# Patient Record
Sex: Female | Born: 1975 | Race: White | Hispanic: No | Marital: Married | Smoking: Never smoker
Health system: Southern US, Community
[De-identification: ages and names within clinical notes are randomized; demographics above are authoritative.]

## PROBLEM LIST (undated history)

## (undated) DIAGNOSIS — Z9889 Other specified postprocedural states: Secondary | ICD-10-CM

## (undated) DIAGNOSIS — R112 Nausea with vomiting, unspecified: Secondary | ICD-10-CM

## (undated) HISTORY — PX: BREAST SURGERY: SHX581

---

## 2000-11-26 ENCOUNTER — Emergency Department (HOSPITAL_COMMUNITY): Admission: EM | Admit: 2000-11-26 | Discharge: 2000-11-26 | Payer: Self-pay | Admitting: Emergency Medicine

## 2001-01-17 ENCOUNTER — Emergency Department (HOSPITAL_COMMUNITY): Admission: EM | Admit: 2001-01-17 | Discharge: 2001-01-17 | Payer: Self-pay | Admitting: Emergency Medicine

## 2011-07-27 HISTORY — PX: AUGMENTATION MAMMAPLASTY: SUR837

## 2014-02-21 DIAGNOSIS — N920 Excessive and frequent menstruation with regular cycle: Secondary | ICD-10-CM | POA: Insufficient documentation

## 2014-02-21 DIAGNOSIS — Z0000 Encounter for general adult medical examination without abnormal findings: Secondary | ICD-10-CM | POA: Insufficient documentation

## 2016-01-20 DIAGNOSIS — C4491 Basal cell carcinoma of skin, unspecified: Secondary | ICD-10-CM

## 2016-01-20 HISTORY — DX: Basal cell carcinoma of skin, unspecified: C44.91

## 2017-11-21 ENCOUNTER — Encounter: Payer: Self-pay | Admitting: Obstetrics & Gynecology

## 2017-11-21 ENCOUNTER — Ambulatory Visit (INDEPENDENT_AMBULATORY_CARE_PROVIDER_SITE_OTHER): Payer: BLUE CROSS/BLUE SHIELD | Admitting: Obstetrics & Gynecology

## 2017-11-21 VITALS — BP 120/80 | Ht 63.0 in | Wt 159.0 lb

## 2017-11-21 DIAGNOSIS — O209 Hemorrhage in early pregnancy, unspecified: Secondary | ICD-10-CM | POA: Insufficient documentation

## 2017-11-21 NOTE — Progress Notes (Signed)
Obstetric Problem Visit   Chief Complaint:  Chief Complaint  Patient presents with  . Vaginal Bleeding   History of Present Illness: Patient is a 42 y.o. G2P1001  presenting for first trimester bleeding.  The onset of bleeding was Friday spotting then heavy on Sunday.  No pain, nausea.  Some fatigue, headache (also has stopped caffeine 4/16 when had first pos urine hcg). LMP 10/11/17    EDC 07/18/18    5 6/7 weeks now    First urine preg test pos = 11/08/17 Is bleeding equal to or greater than normal menstrual flow:  Yes Any recent trauma:  No Recent intercourse:  No History of prior miscarriage:  No Prior ultrasound demonstrating IUP:  No Prior ultrasound demonstrating viable IUP:  No Prior Serum HCG:  No Rh status: unk  PMHx: She  has no past medical history on file. Also,  has a past surgical history that includes Breast surgery., family history is not on file.,  reports that she has never smoked. She has never used smokeless tobacco. She reports that she does not drink alcohol or use drugs. HY:WVPXT NSVD 7 years ago after 67 hours of labor. [facility])  She has a current medication list which includes the following prescription(s): [facility]. Also, has No Known Allergies.  Review of Systems  Constitutional: Negative for chills, fever and malaise/fatigue.  HENT: Negative for congestion, sinus pain and sore throat.   Eyes: Negative for blurred vision and pain.  Respiratory: Negative for cough and wheezing.   Cardiovascular: Negative for chest pain and leg swelling.  Gastrointestinal: Negative for abdominal pain, constipation, diarrhea, heartburn, nausea and vomiting.  Genitourinary: Negative for dysuria, frequency, hematuria and urgency.  Musculoskeletal: Negative for back pain, joint pain, myalgias and neck pain.  Skin: Negative for itching and rash.  Neurological: Negative for dizziness, tremors and weakness.  Endo/Heme/Allergies: Does not bruise/bleed easily.    Psychiatric/Behavioral: Negative for depression. The patient is not nervous/anxious and does not have insomnia.    Objective: Vitals:   11/21/17 1054  BP: 120/80   Physical Exam  Constitutional: She is oriented to person, place, and time. She appears well-developed and well-nourished. No distress.  Musculoskeletal: Normal range of motion.  Neurological: She is alert and oriented to person, place, and time.  Skin: Skin is warm and dry.  Psychiatric: She has a normal mood and affect.  Vitals reviewed.  Assessment: 42 y.o. G2P1001 5 6/7 weeks based on LMP 1. First trimester bleeding Concern for miscarriage based on amount of bleeding - Beta hCG quant (ref lab) - Beta hCG quant (ref lab); Future  1) First trimester bleeding - incidence and clinical course of first trimester bleeding is discussed in detail with the patient today.  Approximately 1/3 of pregnancies ending in live births experienced 1st trimester bleeding.  The amount of bleeding is variable and not necessarily predictive of outcome.  Sources may be cervical or uterine.  Subchorionic hemorrhages are a frequent concurrent findings on ultrasound and are followed expectantly.  These often absorb or regress spontaneously although risk for expansion and further disruption of the utero-placental interface leading to miscarriage is possible.  There is no clearly documented benefit to limiting or modifying activity and sexual intercourse in altering clinic course of 1st trimester bleeding.    2) If not already done will proceed with TVUS evaluation to document viability, and if uncertain viability or absence of a demonstrable IUP (and no previous documentation of IUP) will trend HCG levels.  3) The  patient is Rh unk, rhogam is therefore possibly indicated to decrease the risk rhesus alloimmunization.  Will check level.  4) Routine bleeding precautions were discussed with the patient prior the conclusion of today's visit.  Barnett Applebaum,  MD, [facility], [facility] 11/21/2017  11:27 AM

## 2017-11-21 NOTE — Patient Instructions (Signed)
Human Chorionic Gonadotropin Test °Human chorionic gonadotropin (hCG) is a hormone produced during pregnancy by the cells that form the placenta. The placenta is the organ that grows inside your womb (uterus) to nourish a developing baby. When you are pregnant, hCG starts to appear in your blood about 11 days after conception. It continues to go up for the first 8-11 weeks of pregnancy. °Your hCG level can be measured with several different types of tests. You may have: °· A urine test. °? hCG is eliminated from your body by your kidneys, so a urine test is one way to check for this hormone. °? A urine test only shows whether there is hCG in your urine. It does not measure how much. °? You may have a urine test to find out whether you are pregnant. °? A home pregnancy test detects whether there is hCG in your urine. °· A qualitative blood test. °? Like the urine test, this blood test only shows whether there is hCG in your blood. It does not measure how much. °? You may have this type of blood test to find out whether you are pregnant. °· A quantitative blood test. °? This type of blood test measures the amount of hCG in your blood. °? You may have this type of test to diagnose an abnormal pregnancy or determine whether you are at risk of, or have had, a failed pregnancy (miscarriage). ° °How do I prepare for this test? °For the urine test: °· Limit your fluid intake before the urine test as directed by your health care provider. °· Collect the sample the first time you urinate in the morning. °· Let your health care provider know if you have blood in your urine. This may interfere with the test result. ° °Some medicines may interfere with the urine and blood tests. Let your health care provider know about all the medicines you are taking. No additional preparation is required for the blood test. °What do the results mean? °It is your responsibility to obtain your test results. Ask the lab or department performing  the test when and how you will get your results. Talk to your health care provider if you have any questions about your test results. °The results of the hCG urine test and the qualitative hCG blood test are either positive or negative. The results of the quantitative hCG blood test are reported as a number. hCG is measured in international units per liter (IU/L). °Meaning of Negative Test Results °A negative result on a urine or qualitative blood test could mean that you are not pregnant. It could also mean the test was done too early to detect hCG. If you still have other signs of pregnancy, the test should be repeated. °Meaning of Positive Test Results °A positive result on the urine or qualitative blood tests means you are most likely pregnant. Your health care provider may confirm your pregnancy with an imaging study of the inside of your uterus at 5-6 weeks (ultrasound). °Range of Normal Values °Ranges for normal values for the quantitative hCG blood test may vary among different labs and hospitals. You should always check with your health care provider after having lab work or other tests done to discuss whether your values are considered within normal limits. °· Less than 5 IU/L means it is most likely you are not pregnant. °· Greater than 25 IU/L means it is most likely you are pregnant. ° °Meaning of Results Outside Normal Value Ranges °If your hCG   level on the quantitative test is not what would be expected, you may have the test again. It may also be important for your health care provider to know whether your hCG level goes up or down over time. Common causes of results outside the normal range include: °· Being pregnant with twins (hCG level is higher than expected). °· Having an ectopic pregnancy (hCG rises more slowly than expected). °· Miscarriage (hCG level falls). °· Abnormal growths in the womb (hCG level is higher than expected). ° °Talk with your health care provider to discuss your results,  treatment options, and if necessary, the need for more tests. Talk with your health care provider if you have any questions about your results. °This information is not intended to replace advice given to you by your health care provider. Make sure you discuss any questions you have with your health care provider. °Document Released: 08/13/2004 Document Revised: 03/17/2016 Document Reviewed: 10/16/2013 °Elsevier Interactive Patient Education © 2018 Elsevier Inc. ° °

## 2017-11-22 LAB — BETA HCG QUANT (REF LAB): hCG Quant: 175 m[IU]/mL

## 2017-11-23 ENCOUNTER — Other Ambulatory Visit: Payer: BLUE CROSS/BLUE SHIELD

## 2017-11-23 DIAGNOSIS — O209 Hemorrhage in early pregnancy, unspecified: Secondary | ICD-10-CM

## 2017-11-24 ENCOUNTER — Ambulatory Visit (INDEPENDENT_AMBULATORY_CARE_PROVIDER_SITE_OTHER): Payer: BLUE CROSS/BLUE SHIELD | Admitting: Obstetrics & Gynecology

## 2017-11-24 ENCOUNTER — Encounter: Payer: Self-pay | Admitting: Obstetrics & Gynecology

## 2017-11-24 VITALS — BP 100/60 | Ht 63.0 in | Wt 160.0 lb

## 2017-11-24 DIAGNOSIS — Z86018 Personal history of other benign neoplasm: Secondary | ICD-10-CM

## 2017-11-24 DIAGNOSIS — N926 Irregular menstruation, unspecified: Secondary | ICD-10-CM

## 2017-11-24 LAB — BETA HCG QUANT (REF LAB): hCG Quant: 55 m[IU]/mL

## 2017-11-24 LAB — RH TYPE: Rh Factor: POSITIVE

## 2017-11-24 NOTE — Progress Notes (Signed)
  HPI: Pt has had less bleeding since Monday.  Min pain.  Does have headache and fatigue.   Beta hCG Wed was 55, and was 175 on Monday.  PMHx: She  has no past medical history on file. Also,  has a past surgical history that includes Breast surgery., family history is not on file.,  reports that she has never smoked. She has never used smokeless tobacco. She reports that she does not drink alcohol or use drugs.  She has a current medication list which includes the following prescription(s): [facility]. Also, has No Known Allergies.  Review of Systems  All other systems reviewed and are negative.  Objective: BP 100/60   Ht 5\' 3"  (1.6 m)   Wt 160 lb (72.6 kg)   LMP 10/11/2017   BMI 28.34 kg/m   Physical examination Constitutional NAD, Conversant  Skin No rashes, lesions or ulceration.   Extremities: Moves all appropriately.  Normal ROM for age. No lymphadenopathy.  Neuro: Grossly intact  Psych: Oriented to PPT.  Normal mood. Normal affect.   Assessment: 1. Miscarriage, with declining hCG levels and already episode of bleeding. Monitor for any change in sx's. Pt reassured. 2. History of uterine fibroid and Irregular bleeding - Plan: US PELVIS TRANSVANGINAL NON-OB (TV ONLY) - also, FH fibroids - Risks of fibroids and pregnancy discussed - AMA also discussed, and recurrent miscarriage risks counseled  A total of 25 minutes were spent face-to-face with the patient during this encounter and over half of that time dealt with counseling and coordination of care.  Barnett Applebaum, MD, [facility], [facility] 11/24/2017  4:50 PM

## 2017-12-02 ENCOUNTER — Encounter: Payer: Self-pay | Admitting: Obstetrics and Gynecology

## 2017-12-14 ENCOUNTER — Encounter: Payer: Self-pay | Admitting: Obstetrics & Gynecology

## 2017-12-14 ENCOUNTER — Ambulatory Visit (INDEPENDENT_AMBULATORY_CARE_PROVIDER_SITE_OTHER): Payer: BLUE CROSS/BLUE SHIELD

## 2017-12-14 ENCOUNTER — Ambulatory Visit (INDEPENDENT_AMBULATORY_CARE_PROVIDER_SITE_OTHER): Payer: BLUE CROSS/BLUE SHIELD | Admitting: Obstetrics & Gynecology

## 2017-12-14 VITALS — BP 100/60 | Ht 63.0 in | Wt 160.0 lb

## 2017-12-14 DIAGNOSIS — N96 Recurrent pregnancy loss: Secondary | ICD-10-CM | POA: Diagnosis not present

## 2017-12-14 DIAGNOSIS — N926 Irregular menstruation, unspecified: Secondary | ICD-10-CM | POA: Diagnosis not present

## 2017-12-14 DIAGNOSIS — Z86018 Personal history of other benign neoplasm: Secondary | ICD-10-CM

## 2017-12-14 NOTE — Progress Notes (Signed)
  HPI: Pt is a G2P1 w recent second miscarriage who presents for concerns over pelvic anatomy.  FH fibroids, also reports h/o fibroid herself in past.  Periods reg prior to recent pregnancy, has not had one since miscarriage yet.  Ultrasound demonstrates no masses seen, no cysts, no fibroids These findings are Pelvis normal  PMHx: She  has no past medical history on file. Also,  has a past surgical history that includes Breast surgery., family history is not on file.,  reports that she has never smoked. She has never used smokeless tobacco. She reports that she does not drink alcohol or use drugs.  She has a current medication list which includes the following prescription(s): [facility]. Also, has No Known Allergies.  Review of Systems  Constitutional: Negative for chills, fever and malaise/fatigue.  HENT: Negative for congestion, sinus pain and sore throat.   Eyes: Negative for blurred vision and pain.  Respiratory: Negative for cough and wheezing.   Cardiovascular: Negative for chest pain and leg swelling.  Gastrointestinal: Negative for abdominal pain, constipation, diarrhea, heartburn, nausea and vomiting.  Genitourinary: Negative for dysuria, frequency, hematuria and urgency.  Musculoskeletal: Negative for back pain, joint pain, myalgias and neck pain.  Skin: Negative for itching and rash.  Neurological: Negative for dizziness, tremors and weakness.  Endo/Heme/Allergies: Does not bruise/bleed easily.  Psychiatric/Behavioral: Negative for depression. The patient is not nervous/anxious and does not have insomnia.    Objective: BP 100/60   Ht 5\' 3"  (1.6 m)   Wt 160 lb (72.6 kg)   LMP 10/11/2017   BMI 28.34 kg/m   Physical examination Constitutional NAD, Conversant  Skin No rashes, lesions or ulceration.   Extremities: Moves all appropriately.  Normal ROM for age. No lymphadenopathy.  Neuro: Grossly intact  Psych: Oriented to PPT.  Normal mood. Normal affect.   Assessment:   Recurrent pregnancy loss Pt counseled to try again for pregnancy soon, early testing for reassurance [place] normal without fibroids  A total of 15 minutes were spent face-to-face with the patient during this encounter and over half of that time dealt with counseling and coordination of care.  Barnett Applebaum, MD, [facility], [facility] 12/14/2017  4:06 PM

## 2018-03-28 ENCOUNTER — Other Ambulatory Visit: Payer: BLUE CROSS/BLUE SHIELD

## 2018-03-28 ENCOUNTER — Telehealth: Payer: Self-pay

## 2018-03-28 ENCOUNTER — Other Ambulatory Visit: Payer: Self-pay | Admitting: Obstetrics & Gynecology

## 2018-03-28 DIAGNOSIS — O09299 Supervision of pregnancy with other poor reproductive or obstetric history, unspecified trimester: Secondary | ICD-10-CM

## 2018-03-28 NOTE — Telephone Encounter (Signed)
Patient calling triage today and states she has had a positive preg test and that [facility] told her to call once she did so she can have blood work done. Please advise/put in order if you would like this done for pt please.

## 2018-03-28 NOTE — Telephone Encounter (Signed)
Beta hCG lab today, appt Thurs day w me 11:00 or so (am).

## 2018-03-28 NOTE — Telephone Encounter (Signed)
Please call pt and put on lab schedule for today and then f/up with [facility] on Thursday

## 2018-03-28 NOTE — Telephone Encounter (Signed)
Patient is schedule 

## 2018-03-29 LAB — BETA HCG QUANT (REF LAB): HCG QUANT: 327 m[IU]/mL

## 2018-03-30 ENCOUNTER — Other Ambulatory Visit: Payer: Self-pay | Admitting: Obstetrics & Gynecology

## 2018-03-30 ENCOUNTER — Ambulatory Visit (INDEPENDENT_AMBULATORY_CARE_PROVIDER_SITE_OTHER): Payer: BLUE CROSS/BLUE SHIELD | Admitting: Obstetrics & Gynecology

## 2018-03-30 ENCOUNTER — Telehealth: Payer: Self-pay | Admitting: Obstetrics & Gynecology

## 2018-03-30 ENCOUNTER — Encounter: Payer: Self-pay | Admitting: Obstetrics & Gynecology

## 2018-03-30 VITALS — BP 100/60 | Ht 63.0 in | Wt 168.0 lb

## 2018-03-30 DIAGNOSIS — N96 Recurrent pregnancy loss: Secondary | ICD-10-CM

## 2018-03-30 DIAGNOSIS — N926 Irregular menstruation, unspecified: Secondary | ICD-10-CM

## 2018-03-30 DIAGNOSIS — Z8759 Personal history of other complications of pregnancy, childbirth and the puerperium: Secondary | ICD-10-CM | POA: Diagnosis not present

## 2018-03-30 LAB — BETA HCG QUANT (REF LAB): hCG Quant: 731 m[IU]/mL

## 2018-03-30 NOTE — Patient Instructions (Signed)
First Trimester of Pregnancy The first trimester of pregnancy is from week 1 until the end of week 13 (months 1 through 3). A week after a sperm fertilizes an egg, the egg will implant on the wall of the uterus. This embryo will begin to develop into a baby. Genes from you and your partner will form the baby. The female genes will determine whether the baby will be a boy or a girl. At 6-8 weeks, the eyes and face will be formed, and the heartbeat can be seen on ultrasound. At the end of 12 weeks, all the baby's organs will be formed. Now that you are pregnant, you will want to do everything you can to have a healthy baby. Two of the most important things are to get good prenatal care and to follow your health care provider's instructions. Prenatal care is all the medical care you receive before the baby's birth. This care will help prevent, find, and treat any problems during the pregnancy and childbirth. Body changes during your first trimester Your body goes through many changes during pregnancy. The changes vary from woman to woman.  You may gain or lose a couple of pounds at first.  You may feel sick to your stomach (nauseous) and you may throw up (vomit). If the vomiting is uncontrollable, call your health care provider.  You may tire easily.  You may develop headaches that can be relieved by medicines. All medicines should be approved by your health care provider.  You may urinate more often. Painful urination may mean you have a bladder infection.  You may develop heartburn as a result of your pregnancy.  You may develop constipation because certain hormones are causing the muscles that push stool through your intestines to slow down.  You may develop hemorrhoids or swollen veins (varicose veins).  Your breasts may begin to grow larger and become tender. Your nipples may stick out more, and the tissue that surrounds them (areola) may become darker.  Your gums may bleed and may be  sensitive to brushing and flossing.  Dark spots or blotches (chloasma, mask of pregnancy) may develop on your face. This will likely fade after the baby is born.  Your menstrual periods will stop.  You may have a loss of appetite.  You may develop cravings for certain kinds of food.  You may have changes in your emotions from day to day, such as being excited to be pregnant or being concerned that something may go wrong with the pregnancy and baby.  You may have more vivid and strange dreams.  You may have changes in your hair. These can include thickening of your hair, rapid growth, and changes in texture. Some women also have hair loss during or after pregnancy, or hair that feels dry or thin. Your hair will most likely return to normal after your baby is born.  What to expect at prenatal visits During a routine prenatal visit:  You will be weighed to make sure you and the baby are growing normally.  Your blood pressure will be taken.  Your abdomen will be measured to track your baby's growth.  The fetal heartbeat will be listened to between weeks 10 and 14 of your pregnancy.  Test results from any previous visits will be discussed.  Your health care provider may ask you:  How you are feeling.  If you are feeling the baby move.  If you have had any abnormal symptoms, such as leaking fluid, bleeding, severe headaches,   or abdominal cramping.  If you are using any tobacco products, including cigarettes, chewing tobacco, and electronic cigarettes.  If you have any questions.  Other tests that may be performed during your first trimester include:  Blood tests to find your blood type and to check for the presence of any previous infections. The tests will also be used to check for low iron levels (anemia) and protein on red blood cells (Rh antibodies). Depending on your risk factors, or if you previously had diabetes during pregnancy, you may have tests to check for high blood  sugar that affects pregnant women (gestational diabetes).  Urine tests to check for infections, diabetes, or protein in the urine.  An ultrasound to confirm the proper growth and development of the baby.  Fetal screens for spinal cord problems (spina bifida) and Down syndrome.  HIV (human immunodeficiency virus) testing. Routine prenatal testing includes screening for HIV, unless you choose not to have this test.  You may need other tests to make sure you and the baby are doing well.  Follow these instructions at home: Medicines  Follow your health care provider's instructions regarding medicine use. Specific medicines may be either safe or unsafe to take during pregnancy.  Take a prenatal vitamin that contains at least 600 micrograms (mcg) of folic acid.  If you develop constipation, try taking a stool softener if your health care provider approves. Eating and drinking  Eat a balanced diet that includes fresh fruits and vegetables, whole grains, good sources of protein such as meat, eggs, or tofu, and low-fat dairy. Your health care provider will help you determine the amount of weight gain that is right for you.  Avoid raw meat and uncooked cheese. These carry germs that can cause birth defects in the baby.  Eating four or five small meals rather than three large meals a day may help relieve nausea and vomiting. If you start to feel nauseous, eating a few soda crackers can be helpful. Drinking liquids between meals, instead of during meals, also seems to help ease nausea and vomiting.  Limit foods that are high in fat and processed sugars, such as fried and sweet foods.  To prevent constipation: ? Eat foods that are high in fiber, such as fresh fruits and vegetables, whole grains, and beans. ? Drink enough fluid to keep your urine clear or pale yellow. Activity  Exercise only as directed by your health care provider. Most women can continue their usual exercise routine during  pregnancy. Try to exercise for 30 minutes at least 5 days a week. Exercising will help you: ? Control your weight. ? Stay in shape. ? Be prepared for labor and delivery.  Experiencing pain or cramping in the lower abdomen or lower back is a good sign that you should stop exercising. Check with your health care provider before continuing with normal exercises.  Try to avoid standing for long periods of time. Move your legs often if you must stand in one place for a long time.  Avoid heavy lifting.  Wear low-heeled shoes and practice good posture.  You may continue to have sex unless your health care provider tells you not to. Relieving pain and discomfort  Wear a good support bra to relieve breast tenderness.  Take warm sitz baths to soothe any pain or discomfort caused by hemorrhoids. Use hemorrhoid cream if your health care provider approves.  Rest with your legs elevated if you have leg cramps or low back pain.  If you develop   varicose veins in your legs, wear support hose. Elevate your feet for 15 minutes, 3-4 times a day. Limit salt in your diet. Prenatal care  Schedule your prenatal visits by the twelfth week of pregnancy. They are usually scheduled monthly at first, then more often in the last 2 months before delivery.  Write down your questions. Take them to your prenatal visits.  Keep all your prenatal visits as told by your health care provider. This is important. Safety  Wear your seat belt at all times when driving.  Make a list of emergency phone numbers, including numbers for family, friends, the hospital, and police and fire departments. General instructions  Ask your health care provider for a referral to a local prenatal education class. Begin classes no later than the beginning of month 6 of your pregnancy.  Ask for help if you have counseling or nutritional needs during pregnancy. Your health care provider can offer advice or refer you to specialists for help  with various needs.  Do not use hot tubs, steam rooms, or saunas.  Do not douche or use tampons or scented sanitary pads.  Do not cross your legs for long periods of time.  Avoid cat litter boxes and soil used by cats. These carry germs that can cause birth defects in the baby and possibly loss of the fetus by miscarriage or stillbirth.  Avoid all smoking, herbs, alcohol, and medicines not prescribed by your health care provider. Chemicals in these products affect the formation and growth of the baby.  Do not use any products that contain nicotine or tobacco, such as cigarettes and e-cigarettes. If you need help quitting, ask your health care provider. You may receive counseling support and other resources to help you quit.  Schedule a dentist appointment. At home, brush your teeth with a soft toothbrush and be gentle when you floss. Contact a health care provider if:  You have dizziness.  You have mild pelvic cramps, pelvic pressure, or nagging pain in the abdominal area.  You have persistent nausea, vomiting, or diarrhea.  You have a bad smelling vaginal discharge.  You have pain when you urinate.  You notice increased swelling in your face, hands, legs, or ankles.  You are exposed to fifth disease or chickenpox.  You are exposed to German measles (rubella) and have never had it. Get help right away if:  You have a fever.  You are leaking fluid from your vagina.  You have spotting or bleeding from your vagina.  You have severe abdominal cramping or pain.  You have rapid weight gain or loss.  You vomit blood or material that looks like coffee grounds.  You develop a severe headache.  You have shortness of breath.  You have any kind of trauma, such as from a fall or a car accident. Summary  The first trimester of pregnancy is from week 1 until the end of week 13 (months 1 through 3).  Your body goes through many changes during pregnancy. The changes vary from  woman to woman.  You will have routine prenatal visits. During those visits, your health care provider will examine you, discuss any test results you may have, and talk with you about how you are feeling. This information is not intended to replace advice given to you by your health care provider. Make sure you discuss any questions you have with your health care provider. Document Released: 07/06/2001 Document Revised: 06/23/2016 Document Reviewed: 06/23/2016 Elsevier Interactive Patient Education  2018 Elsevier   Inc.  

## 2018-03-30 NOTE — Telephone Encounter (Signed)
-----   Message from Gae Dry, MD sent at 03/30/2018  1:52 PM EDT ----- Regarding: appt She has appt 9/24 at [address] [place] She needed to have early OB US appt prior to appt, so plz try to schedule that morning or sometime BEFORE 130 Let her know (shes aware of change to be made) Thx

## 2018-03-30 NOTE — Progress Notes (Signed)
  History of Present Illness:  Angela [name] is a 42 y.o. G3P1011 who recently underwent labwork for early pregnancy (prior miscarriage).  She presents for discussion of results and plan of management. Results revealed Beta 327 on Monday.  Repeat testing today.  PMHx: She  has no past medical history on file. Also,  has a past surgical history that includes Breast surgery., family history is not on file.,  reports that she has never smoked. She has never used smokeless tobacco. She reports that she does not drink alcohol or use drugs. No outpatient medications have been marked as taking for the 03/30/18 encounter (Office Visit) with Gae Dry, MD.  . Also, has No Known Allergies..  Review of Systems  All other systems reviewed and are negative.  Physical Exam:  BP 100/60   Ht 5\' 3"  (1.6 m)   Wt 168 lb (76.2 kg)   LMP 10/11/2017   BMI 29.76 kg/m  Body mass index is 29.76 kg/m. Constitutional: Well nourished, well developed female in no acute distress.  Abdomen: diffusely non tender to palpation, non distended, and no masses, hernias Neuro: Grossly intact Psych:  Normal mood and affect.    Assessment:   History of miscarriage    -  Primary   Relevant Orders   Beta hCG quant (ref lab)    Plan: Detailed discussion of results today. Options for management discussed. Info provided. At this time, we will plan for BETA today and [place] 2 weeks if rising appropriately. PNC discussed.  Travel discussed (she travels to [place] and [place] this weekend). Zika risk discussed.  A total of 15 minutes were spent face-to-face with the patient during this encounter and over half of that time dealt with counseling and coordination of care.  Barnett Applebaum, MD, [facility], [facility] 03/30/2018  11:07 AM

## 2018-03-30 NOTE — Telephone Encounter (Signed)
Patient is schedule 04/18/18 with ultrasound at 12 pm

## 2018-04-18 ENCOUNTER — Ambulatory Visit (INDEPENDENT_AMBULATORY_CARE_PROVIDER_SITE_OTHER): Payer: BLUE CROSS/BLUE SHIELD

## 2018-04-18 ENCOUNTER — Encounter: Payer: BLUE CROSS/BLUE SHIELD | Admitting: Obstetrics & Gynecology

## 2018-04-18 ENCOUNTER — Other Ambulatory Visit (HOSPITAL_COMMUNITY)
Admission: RE | Admit: 2018-04-18 | Discharge: 2018-04-18 | Disposition: A | Discharge status: Home / Self Care | Payer: BLUE CROSS/BLUE SHIELD | Source: Ambulatory Visit | Attending: Obstetrics & Gynecology | Admitting: Obstetrics & Gynecology

## 2018-04-18 ENCOUNTER — Ambulatory Visit (INDEPENDENT_AMBULATORY_CARE_PROVIDER_SITE_OTHER): Payer: BLUE CROSS/BLUE SHIELD | Admitting: Obstetrics & Gynecology

## 2018-04-18 ENCOUNTER — Encounter: Payer: Self-pay | Admitting: Obstetrics & Gynecology

## 2018-04-18 VITALS — BP 102/58 | Wt 170.0 lb

## 2018-04-18 DIAGNOSIS — O0991 Supervision of high risk pregnancy, unspecified, first trimester: Secondary | ICD-10-CM | POA: Diagnosis present

## 2018-04-18 DIAGNOSIS — Z8759 Personal history of other complications of pregnancy, childbirth and the puerperium: Secondary | ICD-10-CM

## 2018-04-18 DIAGNOSIS — Z3A01 Less than 8 weeks gestation of pregnancy: Secondary | ICD-10-CM | POA: Diagnosis not present

## 2018-04-18 DIAGNOSIS — N8312 Corpus luteum cyst of left ovary: Secondary | ICD-10-CM | POA: Diagnosis not present

## 2018-04-18 DIAGNOSIS — N926 Irregular menstruation, unspecified: Secondary | ICD-10-CM

## 2018-04-18 DIAGNOSIS — O09521 Supervision of elderly multigravida, first trimester: Secondary | ICD-10-CM

## 2018-04-18 DIAGNOSIS — O3411 Maternal care for benign tumor of corpus uteri, first trimester: Secondary | ICD-10-CM

## 2018-04-18 NOTE — Patient Instructions (Signed)

## 2018-04-18 NOTE — Progress Notes (Signed)
04/18/2018   Chief Complaint: Missed period  Transfer of Care Patient: no  History of Present Illness: Ms. Eisenbeis is a 42 y.o. G3P1011 [redacted]w[redacted]d based on Patient's last menstrual period was 03/01/2018 (exact date). with an Estimated Date of Delivery: 12/13/18, with the above CC.   Her periods were: regular periods every 28 days She was using no method when she conceived.  She has Positive signs or symptoms of nausea/vomiting of pregnancy. She has Negative signs or symptoms of miscarriage or preterm labor She identifies travel to areas whereZika risk factors for her On any different medications around the time she conceived/early pregnancy: No  History of varicella: Yes   ROS: A 12-point review of systems was performed and negative, except as stated in the above HPI.  OBGYN History: As per HPI. OB History  Gravida Para Term Preterm AB Living  3 1 1   1 1   SAB TAB Ectopic Multiple Live Births  1            # Outcome Date GA Lbr Len/2nd Weight Sex Delivery Anes PTL Lv  3 Current           2 SAB 11/24/17          1 Term             Any issues with any prior pregnancies: miscarriage Any prior children are healthy, doing well, without any problems or issues: yes History of pap smears: Yes. Last pap smear 2018. Abnormal: no  History of STIs: No   Past Medical History: No past medical history on file.  Past Surgical History: Past Surgical History:  Procedure Laterality Date  . BREAST SURGERY      Family History:  No family history on file. She denies any female cancers, bleeding or blood clotting disorders.  She denies any history of mental retardation, birth defects or genetic disorders in her or the FOB's history  Social History:  Social History   Socioeconomic History  . Marital status: Single    Spouse name: Not on file  . Number of children: Not on file  . Years of education: Not on file  . Highest education level: Not on file  Occupational History  . Not on file   Social Needs  . Financial resource strain: Not on file  . Food insecurity:    Worry: Not on file    Inability: Not on file  . Transportation needs:    Medical: Not on file    Non-medical: Not on file  Tobacco Use  . Smoking status: Never Smoker  . Smokeless tobacco: Never Used  Substance and Sexual Activity  . Alcohol use: Never    Frequency: Never  . Drug use: Never  . Sexual activity: Not Currently  Lifestyle  . Physical activity:    Days per week: Not on file    Minutes per session: Not on file  . Stress: Not on file  Relationships  . Social connections:    Talks on phone: Not on file    Gets together: Not on file    Attends religious service: Not on file    Active member of club or organization: Not on file    Attends meetings of clubs or organizations: Not on file    Relationship status: Not on file  . Intimate partner violence:    Fear of current or ex partner: Not on file    Emotionally abused: Not on file    Physically abused: Not on  file    Forced sexual activity: Not on file  Other Topics Concern  . Not on file  Social History Narrative  . Not on file   Any pets in the household: no  Allergy: No Known Allergies  Current Outpatient Medications:  Current Outpatient Medications:  .  Prenatal Vit-Fe Fumarate-FA ([facility] PRENATAL) 28-0.8 MG TABS, Take by mouth., Disp: , Rfl:    Physical Exam:   BP (!) 102/58   Wt 170 lb (77.1 kg)   LMP 03/01/2018 (Exact Date)   BMI 30.11 kg/m  Body mass index is 30.11 kg/m. Constitutional: Well nourished, well developed female in no acute distress.  Neck:  Supple, normal appearance, and no thyromegaly  Cardiovascular: S1, S2 normal, no murmur, rub or gallop, regular rate and rhythm Respiratory:  Clear to auscultation bilateral. Normal respiratory effort Abdomen: positive bowel sounds and no masses, hernias; diffusely non tender to palpation, non distended Breasts: breasts appear normal, no suspicious masses, no skin  or nipple changes or axillary nodes. Neuro/Psych:  Normal mood and affect.  Skin:  Warm and dry.  Lymphatic:  No inguinal lymphadenopathy.   Pelvic exam: is not limited by body habitus EGBUS: within normal limits, Vagina: within normal limits and with no blood in the vault, Cervix: normal appearing cervix without discharge or lesions, closed/long/high, Uterus:  enlarged: 6 weeks, and Adnexa:  normal adnexa  Assessment: Ms. Lye is a 42 y.o. G3P1011 [redacted]w[redacted]d based on Patient's last menstrual period was 03/01/2018 (exact date). with an Estimated Date of Delivery: 12/13/18,  for prenatal care.  Plan:  1) Avoid alcoholic beverages. 2) Patient encouraged not to smoke.  3) Discontinue the use of all non-medicinal drugs and chemicals.  4) Take prenatal vitamins daily.  5) Seatbelt use advised 6) Nutrition, food safety (fish, cheese advisories, and high nitrite foods) and exercise discussed. 7) Hospital and practice style delivering at [facility] discussed  8) Patient is asked about travel to areas at risk for the [place] virus, and counseled to avoid travel and exposure to mosquitoes or sexual partners who may have themselves been exposed to the virus. Testing is discussed, and will be ordered as appropriate.  9) Childbirth classes at [facility] advised 10) Genetic Screening, such as with 1st Trimester Screening, cell free fetal DNA, AFP testing, and Ultrasound, as well as with amniocentesis and CVS as appropriate, is discussed with patient. She plans to have genetic testing this pregnancy. 11) [place] 2 weeks for follow up of [facility] 12) cfDNA 10 weeks  Problem list reviewed and updated.  Barnett Applebaum, MD, [facility], [facility] 04/18/2018  1:49 PM

## 2018-04-19 LAB — RPR+RH+ABO+RUB AB+AB SCR+CB...
Antibody Screen: NEGATIVE
HIV SCREEN 4TH GENERATION: NONREACTIVE
Hematocrit: 37.2 % (ref 34.0–46.6)
Hemoglobin: 12.3 g/dL (ref 11.1–15.9)
Hepatitis B Surface Ag: NEGATIVE
MCH: 29.6 pg (ref 26.6–33.0)
MCHC: 33.1 g/dL (ref 31.5–35.7)
MCV: 89 fL (ref 79–97)
PLATELETS: 377 10*3/uL (ref 150–450)
RBC: 4.16 x10E6/uL (ref 3.77–5.28)
RDW: 12.7 % (ref 12.3–15.4)
RPR: NONREACTIVE
Rh Factor: POSITIVE
Rubella Antibodies, IGG: 11.2 index (ref >0.99)
Varicella zoster IgG: 1147 index (ref >165)
WBC: 8.6 10*3/uL (ref 3.4–10.8)

## 2018-04-20 LAB — GC/CHLAMYDIA PROBE AMP (~~LOC~~) NOT AT ARMC
CHLAMYDIA, DNA PROBE: NEGATIVE
NEISSERIA GONORRHEA: NEGATIVE

## 2018-04-20 LAB — URINE CULTURE

## 2018-05-02 ENCOUNTER — Ambulatory Visit (INDEPENDENT_AMBULATORY_CARE_PROVIDER_SITE_OTHER): Payer: BLUE CROSS/BLUE SHIELD | Admitting: Obstetrics & Gynecology

## 2018-05-02 ENCOUNTER — Ambulatory Visit (INDEPENDENT_AMBULATORY_CARE_PROVIDER_SITE_OTHER): Payer: BLUE CROSS/BLUE SHIELD

## 2018-05-02 VITALS — BP 110/70 | Wt 171.0 lb

## 2018-05-02 DIAGNOSIS — Z3A01 Less than 8 weeks gestation of pregnancy: Secondary | ICD-10-CM

## 2018-05-02 DIAGNOSIS — O09521 Supervision of elderly multigravida, first trimester: Secondary | ICD-10-CM

## 2018-05-02 DIAGNOSIS — O021 Missed abortion: Secondary | ICD-10-CM | POA: Diagnosis not present

## 2018-05-02 DIAGNOSIS — O3680X0 Pregnancy with inconclusive fetal viability, not applicable or unspecified: Secondary | ICD-10-CM

## 2018-05-02 DIAGNOSIS — Z23 Encounter for immunization: Secondary | ICD-10-CM

## 2018-05-02 DIAGNOSIS — O0991 Supervision of high risk pregnancy, unspecified, first trimester: Secondary | ICD-10-CM

## 2018-05-02 NOTE — Patient Instructions (Signed)

## 2018-05-02 NOTE — Progress Notes (Signed)
  HPI: Pt has had min pain and no bleeding. Nausea has improved.  Min vreast T.  Pt here for follow up to [place] 2 weeks ago that showed 5 6/7 weeks CRL with FHT 103 and small [place].  Ultrasound demonstrates CRL 6 1/7 weeks w no FHT. These findings are c/w missed abortion.  PMHx: She  has no past medical history on file. Also,  has a past surgical history that includes Breast surgery., family history is not on file.,  reports that she has never smoked. She has never used smokeless tobacco. She reports that she does not drink alcohol or use drugs.  She has a current medication list which includes the following prescription(s): [facility] prenatal. Also, has No Known Allergies.  Review of Systems  All other systems reviewed and are negative.  Objective: BP 110/70   Wt 171 lb (77.6 kg)   LMP 03/01/2018 (Exact Date)   BMI 30.29 kg/m   Physical examination Constitutional NAD, Conversant  Skin No rashes, lesions or ulceration.   Extremities: Moves all appropriately.  Normal ROM for age. No lymphadenopathy.  Neuro: Grossly intact  Psych: Oriented to PPT.  Normal mood. Normal affect.   Assessment:  Missed abortion Monitor for bleeding. D&C and medicine options discussed Future pregnancy options discussed REI for recurrent miscarriage (after 3) discussed AMA (age 42) discussed  Flu shot today  A total of 15 minutes were spent face-to-face with the patient during this encounter and over half of that time dealt with counseling and coordination of care.  Barnett Applebaum, MD, [facility], [facility] 05/02/2018  2:05 PM

## 2018-05-03 LAB — INHERITEST SOCIETY GUIDED

## 2018-05-10 ENCOUNTER — Telehealth: Payer: Self-pay

## 2018-05-10 NOTE — Telephone Encounter (Signed)
Can recheck level or ultrasound or both.  See if she would like to come in Fri for that.  OK to overbook.  Of course check on [place] availability.

## 2018-05-10 NOTE — Telephone Encounter (Signed)
Pt calling today with questions for [facility]  about her symptoms from miscarriage. She states that shes only had Some spotting and some tissue passing, but not a lot. She said compared to last time, she hasnt had much bleeding or cramping at all and wants to make sure this is normal. shes worried about getting infection. Can you please advise? She is aware you are out of office today but wanted me to make sure you knew she called.

## 2018-05-11 NOTE — Telephone Encounter (Signed)
Patient is schedule 05/12/18 at 4:30 and follow up with [facility]

## 2018-05-11 NOTE — Telephone Encounter (Signed)
Can you put her with the 430 u/s and the f/up with rph

## 2018-05-12 ENCOUNTER — Ambulatory Visit (INDEPENDENT_AMBULATORY_CARE_PROVIDER_SITE_OTHER): Payer: BLUE CROSS/BLUE SHIELD

## 2018-05-12 ENCOUNTER — Encounter: Payer: Self-pay | Admitting: Obstetrics & Gynecology

## 2018-05-12 ENCOUNTER — Ambulatory Visit (INDEPENDENT_AMBULATORY_CARE_PROVIDER_SITE_OTHER): Payer: BLUE CROSS/BLUE SHIELD | Admitting: Obstetrics & Gynecology

## 2018-05-12 VITALS — BP 120/80 | Ht 63.0 in | Wt 170.0 lb

## 2018-05-12 DIAGNOSIS — O021 Missed abortion: Secondary | ICD-10-CM

## 2018-05-12 NOTE — Addendum Note (Signed)
Addended by: Gae Dry on: 05/12/2018 09:35 AM   Modules accepted: Orders

## 2018-05-12 NOTE — Progress Notes (Signed)
  HPI: Pt had some bleeding and tissue like debris.  Prior dx missed abortion.  Concern for whether completed or not.  Ultrasound demonstrates gest sac w yolk sac, no CRL, no FHT. These findings are likely due to incomplete miscarriage at this time  PMHx: She  has no past medical history on file. Also,  has a past surgical history that includes Breast surgery., family history is not on file.,  reports that she has never smoked. She has never used smokeless tobacco. She reports that she does not drink alcohol or use drugs.  She has a current medication list which includes the following prescription(s): [facility]vs prenatal. Also, has No Known Allergies.  Review of Systems  All other systems reviewed and are negative.   Objective: BP 120/80   Ht 5\' 3"  (1.6 m)   Wt 170 lb (77.1 kg)   LMP 03/01/2018 (Exact Date)   BMI 30.11 kg/m   Physical examination Constitutional NAD, Conversant  Skin No rashes, lesions or ulceration.   Extremities: Moves all appropriately.  Normal ROM for age. No lymphadenopathy.  Neuro: Grossly intact  Psych: Oriented to PPT.  Normal mood. Normal affect.   Assessment:  Missed abortion Cont to monitor for completion D&C option discussed but deferred  Barnett Applebaum, MD, [facility], [facility] 05/12/2018  5:02 PM

## 2018-06-28 ENCOUNTER — Encounter: Payer: Self-pay | Admitting: Obstetrics & Gynecology

## 2018-06-29 ENCOUNTER — Other Ambulatory Visit: Payer: BLUE CROSS/BLUE SHIELD

## 2018-06-29 ENCOUNTER — Other Ambulatory Visit: Payer: Self-pay | Admitting: Obstetrics & Gynecology

## 2018-06-29 ENCOUNTER — Telehealth: Payer: Self-pay | Admitting: Obstetrics & Gynecology

## 2018-06-29 DIAGNOSIS — N96 Recurrent pregnancy loss: Secondary | ICD-10-CM

## 2018-06-29 NOTE — Telephone Encounter (Signed)
Patient is calling for labs results. Please advise. 

## 2018-06-29 NOTE — Telephone Encounter (Signed)
Pt aware and is on her way

## 2018-06-29 NOTE — Telephone Encounter (Signed)
Order placed for her to come in and have labs done today (beta)

## 2018-06-30 ENCOUNTER — Other Ambulatory Visit: Payer: Self-pay | Admitting: Obstetrics & Gynecology

## 2018-06-30 DIAGNOSIS — N96 Recurrent pregnancy loss: Secondary | ICD-10-CM

## 2018-06-30 LAB — BETA HCG QUANT (REF LAB): HCG QUANT: 369 m[IU]/mL

## 2018-06-30 NOTE — Progress Notes (Signed)
Pt to have LAB appt 12/9/ at 0830. Please have lab results forwarded to Dr Glennon Mac to evaluate results and call pt. Please schedule NOB w PH in 2 weeks. Thx

## 2018-06-30 NOTE — Telephone Encounter (Signed)
-----   Message from Gae Dry, MD sent at 06/30/2018  8:03 AM EST ----- Pt to have LAB appt 12/9/ at 0830. Please have lab results forwarded to Dr Glennon Mac to evaluate results and call pt. Please schedule NOB w PH in 2 weeks. Thx

## 2018-06-30 NOTE — Telephone Encounter (Signed)
Patient is schedule 07/03/18 for Labsand 07/17/18 with [place]

## 2018-06-30 NOTE — Progress Notes (Signed)
Pt has history of AMA and 2 consequtive SAb Recent home + uCG Beta 12/5 was 363. Plan repeat am of 12/9 Will have someone call results Plan NOB thereafter  Barnett Applebaum, MD, [facility], [facility] 06/30/2018  8:00 AM

## 2018-07-03 ENCOUNTER — Other Ambulatory Visit: Payer: BLUE CROSS/BLUE SHIELD

## 2018-07-03 DIAGNOSIS — N96 Recurrent pregnancy loss: Secondary | ICD-10-CM

## 2018-07-04 ENCOUNTER — Telehealth: Payer: Self-pay | Admitting: Obstetrics & Gynecology

## 2018-07-04 LAB — BETA HCG QUANT (REF LAB): hCG Quant: 353 m[IU]/mL

## 2018-07-04 NOTE — Telephone Encounter (Signed)
Patient is calling for labs results. Please advise. 

## 2018-07-05 NOTE — Telephone Encounter (Signed)
Spoke with patient. Discussed results.  She has had some spotting and cramping (menstrual-like).  She denies any lateralization of her pain and severe pain. The bleeding has been fairly light. We discussed that her hCG drop was not very much. However, her current symptoms developed after her lab draw.   She was given precautions for ectopic pregnancy, including abnormal pain for her, or severe pain with or without heavy bleeding. She is always welcome to call, if she has any concerns. For follow up, I recommended a repeat in her hCG level, especially in light of her new symptoms.  She asked that I forward the message to Dr. Kenton Kingfisher and unless she hears from him, she will keep her previously scheduled appointment on 12/23 and see what he would like to do.   Prentice Docker, MD, [facility], [facility] 07/05/2018 6:32 PM

## 2018-07-05 NOTE — Telephone Encounter (Signed)
Can SDJ Look at this since [facility] not in the office

## 2018-07-05 NOTE — Telephone Encounter (Signed)
I spoke with the patient earlier today and then released her results. Dr. Kenton Kingfisher copied on my documentation of the phone call.

## 2018-07-05 NOTE — Telephone Encounter (Signed)
-----   Message from Gae Dry, MD sent at 06/30/2018  8:03 AM EST ----- Pt to have LAB appt 12/9/ at 0830. Please have lab results forwarded to Dr Glennon Mac to evaluate results and call pt. Please schedule NOB w PH in 2 weeks. Thx

## 2018-07-10 ENCOUNTER — Other Ambulatory Visit: Payer: Self-pay | Admitting: Obstetrics & Gynecology

## 2018-07-10 NOTE — Progress Notes (Signed)
Discussed w pt, miscarriage (third consecutive) Lab testing follow up based on sx's Try for pregnancy again after next normal cycle, if desired REI referral for recurrent miscarriage discussed and offered  Angela Applebaum, MD, [place], [facility] 07/10/2018  8:52 AM

## 2018-07-17 ENCOUNTER — Encounter: Payer: BLUE CROSS/BLUE SHIELD | Admitting: Obstetrics & Gynecology

## 2018-07-20 DIAGNOSIS — N96 Recurrent pregnancy loss: Secondary | ICD-10-CM | POA: Insufficient documentation

## 2018-07-21 ENCOUNTER — Other Ambulatory Visit: Payer: Self-pay | Admitting: Family Medicine

## 2018-07-21 DIAGNOSIS — Z1231 Encounter for screening mammogram for malignant neoplasm of breast: Secondary | ICD-10-CM

## 2018-08-23 ENCOUNTER — Encounter: Payer: Self-pay | Admitting: Radiology

## 2018-08-23 ENCOUNTER — Other Ambulatory Visit: Payer: Self-pay | Admitting: Family Medicine

## 2018-08-23 ENCOUNTER — Ambulatory Visit
Admission: RE | Admit: 2018-08-23 | Discharge: 2018-08-23 | Disposition: A | Discharge status: Home / Self Care | Payer: BLUE CROSS/BLUE SHIELD | Source: Ambulatory Visit | Attending: Family Medicine | Admitting: Family Medicine

## 2018-08-23 DIAGNOSIS — Z1231 Encounter for screening mammogram for malignant neoplasm of breast: Secondary | ICD-10-CM | POA: Diagnosis present

## 2018-09-21 ENCOUNTER — Other Ambulatory Visit: Payer: Self-pay | Admitting: Obstetrics & Gynecology

## 2018-09-21 ENCOUNTER — Encounter: Payer: Self-pay | Admitting: Obstetrics & Gynecology

## 2018-09-21 DIAGNOSIS — N96 Recurrent pregnancy loss: Secondary | ICD-10-CM

## 2018-10-05 ENCOUNTER — Encounter: Payer: Self-pay | Admitting: Obstetrics & Gynecology

## 2018-10-08 ENCOUNTER — Encounter: Payer: Self-pay | Admitting: Obstetrics & Gynecology

## 2018-10-09 ENCOUNTER — Encounter: Payer: Self-pay | Admitting: Obstetrics & Gynecology

## 2018-10-09 ENCOUNTER — Ambulatory Visit (INDEPENDENT_AMBULATORY_CARE_PROVIDER_SITE_OTHER): Payer: BLUE CROSS/BLUE SHIELD | Admitting: Obstetrics & Gynecology

## 2018-10-09 ENCOUNTER — Other Ambulatory Visit: Payer: Self-pay

## 2018-10-09 VITALS — BP 120/80 | Ht 63.0 in | Wt 166.0 lb

## 2018-10-09 DIAGNOSIS — N96 Recurrent pregnancy loss: Secondary | ICD-10-CM

## 2018-10-09 NOTE — Progress Notes (Signed)
  History of Present Illness:  Angela [name] is a 43 y.o. who recently underwent an abnormal period; no sex for 3 mos as she has been awaiting healing from last miscarriage 06/2018 and has seen REI for recurrent pregnancy loss; some labs back awaiting genetic studies.  She had normal period in Jan and Feb but this month had episode of severe pain followed by passage of tissue like mass, then felt better.    PMHx: She  has no past medical history on file. Also,  has a past surgical history that includes Breast surgery and Augmentation mammaplasty (Bilateral, 2013)., family history is not on file.,  reports that she has never smoked. She has never used smokeless tobacco. She reports that she does not drink alcohol or use drugs. No outpatient medications have been marked as taking for the 10/09/18 encounter (Office Visit) with Gae Dry, MD.  . Also, has No Known Allergies..  Review of Systems  All other systems reviewed and are negative.  Physical Exam:  BP 120/80   Ht 5\' 3"  (1.6 m)   Wt 166 lb (75.3 kg)   LMP 10/02/2018   BMI 29.41 kg/m  Body mass index is 29.41 kg/m. Constitutional: Well nourished, well developed female in no acute distress.  Abdomen: diffusely non tender to palpation, non distended, and no masses, hernias Neuro: Grossly intact Psych:  Normal mood and affect.    Assessment:   Recurrent pregnancy loss    -  Primary Recent passage of clot or tissue, unexplained as has had 2 normal periods since miscarriage in Dec (betas were down to 300 then)   Relevant Orders   Beta hCG quant (ref lab) Monitor bleeding and periods OK to try for pregnancy again    Await info from REI Dr Bjorn Loser as far as any other management decisions regarding pregnancy    A total of 15 minutes were spent face-to-face with the patient during this encounter and over half of that time dealt with counseling and coordination of care.  Barnett Applebaum, MD, [facility], [facility] 10/09/2018  4:25 PM

## 2018-10-10 ENCOUNTER — Encounter: Payer: Self-pay | Admitting: Obstetrics & Gynecology

## 2018-10-10 LAB — BETA HCG QUANT (REF LAB)

## 2018-10-20 ENCOUNTER — Encounter: Payer: Self-pay | Admitting: Obstetrics & Gynecology

## 2019-03-08 ENCOUNTER — Encounter: Payer: Self-pay | Admitting: Obstetrics & Gynecology

## 2019-03-27 ENCOUNTER — Other Ambulatory Visit: Payer: Self-pay

## 2019-03-27 ENCOUNTER — Telehealth: Payer: Self-pay | Admitting: Obstetrics & Gynecology

## 2019-03-27 ENCOUNTER — Other Ambulatory Visit: Payer: Self-pay | Admitting: Obstetrics & Gynecology

## 2019-03-27 ENCOUNTER — Other Ambulatory Visit: Payer: BC Managed Care – PPO

## 2019-03-27 ENCOUNTER — Encounter: Payer: Self-pay | Admitting: Obstetrics & Gynecology

## 2019-03-27 DIAGNOSIS — N96 Recurrent pregnancy loss: Secondary | ICD-10-CM

## 2019-03-27 NOTE — Telephone Encounter (Signed)
Patient is schedule today 03/27/19 at 2:40 for Beta/ HCG. Please place order. Thank you!

## 2019-03-28 ENCOUNTER — Encounter: Payer: Self-pay | Admitting: Obstetrics & Gynecology

## 2019-03-28 LAB — BETA HCG QUANT (REF LAB): hCG Quant: <1 m[IU]/mL

## 2019-04-05 ENCOUNTER — Encounter: Payer: Self-pay | Admitting: Obstetrics & Gynecology

## 2020-07-09 ENCOUNTER — Other Ambulatory Visit: Payer: Self-pay | Admitting: Family Medicine

## 2020-07-09 DIAGNOSIS — R079 Chest pain, unspecified: Secondary | ICD-10-CM

## 2020-07-10 ENCOUNTER — Other Ambulatory Visit: Payer: Self-pay | Admitting: Family Medicine

## 2020-07-10 DIAGNOSIS — R079 Chest pain, unspecified: Secondary | ICD-10-CM

## 2020-07-21 ENCOUNTER — Ambulatory Visit (INDEPENDENT_AMBULATORY_CARE_PROVIDER_SITE_OTHER): Payer: BC Managed Care – PPO | Admitting: Obstetrics and Gynecology

## 2020-07-21 ENCOUNTER — Other Ambulatory Visit: Payer: Self-pay

## 2020-07-21 ENCOUNTER — Encounter: Payer: Self-pay | Admitting: Obstetrics and Gynecology

## 2020-07-21 ENCOUNTER — Other Ambulatory Visit (HOSPITAL_COMMUNITY)
Admission: RE | Admit: 2020-07-21 | Discharge: 2020-07-21 | Disposition: A | Discharge status: Home / Self Care | Payer: BC Managed Care – PPO | Source: Ambulatory Visit | Attending: Obstetrics and Gynecology | Admitting: Obstetrics and Gynecology

## 2020-07-21 VITALS — BP 120/82 | Ht 63.0 in | Wt 151.0 lb

## 2020-07-21 DIAGNOSIS — Z7185 Encounter for immunization safety counseling: Secondary | ICD-10-CM

## 2020-07-21 DIAGNOSIS — Z01419 Encounter for gynecological examination (general) (routine) without abnormal findings: Secondary | ICD-10-CM

## 2020-07-21 DIAGNOSIS — Z0000 Encounter for general adult medical examination without abnormal findings: Secondary | ICD-10-CM | POA: Diagnosis not present

## 2020-07-21 DIAGNOSIS — Z124 Encounter for screening for malignant neoplasm of cervix: Secondary | ICD-10-CM

## 2020-07-21 DIAGNOSIS — Z1239 Encounter for other screening for malignant neoplasm of breast: Secondary | ICD-10-CM

## 2020-07-21 NOTE — Progress Notes (Signed)
Gynecology Annual Exam  PCP: Pcp, No  Chief Complaint:  Chief Complaint  Patient presents with  . Gynecologic Exam    Annual - Last pap 07/20/18, no concerns. RM 5    History of Present Illness: Patient is a 44 y.o. [id number] presents for annual exam. The patient has no complaints today. Patient reports preparing for a round of donor egg IVF in March of 2022.  LMP: Patient's last menstrual period was 06/27/2020. Average Interval: regular, 28 days Duration of flow: 6 days Heavy Menses: yes - first 1-2 days of cycle Clots: yes Intermenstrual Bleeding: no Postcoital Bleeding: no Dysmenorrhea: yes   The patient is sexually active. She currently uses none for contraception. She denies dyspareunia.  The patient does not perform self breast exams.  There is no notable family history of breast or ovarian cancer in her family.  The patient wears seatbelts: yes.   The patient has regular exercise: yes.    The patient denies current symptoms of depression.    Review of Systems: ROS  Past Medical History:  Patient Active Problem List   Diagnosis Date Noted  . First trimester bleeding 11/21/2017    Past Surgical History:  Past Surgical History:  Procedure Laterality Date  . AUGMENTATION MAMMAPLASTY Bilateral 9518   silicone per pt  . BREAST SURGERY      Gynecologic History:  Patient's last menstrual period was 06/27/2020. Contraception: none Last Pap: Results were: 2018 no abnormalities  Last mammogram: 2020 Results were: BI-RAD I  Obstetric History: [id number]  Family History:  Family History  Problem Relation Age of Onset  . Breast cancer Neg Hx     Social History:  Social History   Socioeconomic History  . Marital status: Married    Spouse name: Not on file  . Number of children: Not on file  . Years of education: Not on file  . Highest education level: Not on file  Occupational History  . Not on file  Tobacco Use  . Smoking status: Never Smoker  .  Smokeless tobacco: Never Used  Vaping Use  . Vaping Use: Never used  Substance and Sexual Activity  . Alcohol use: Never  . Drug use: Never  . Sexual activity: Not Currently  Other Topics Concern  . Not on file  Social History Narrative  . Not on file   Social Determinants of Health   Financial Resource Strain: Not on file  Food Insecurity: Not on file  Transportation Needs: Not on file  Physical Activity: Not on file  Stress: Not on file  Social Connections: Not on file  Intimate Partner Violence: Not on file    Allergies:  No Known Allergies  Medications: Prior to Admission medications   Medication Sig Start Date End Date Taking? Authorizing Provider  Prenatal Vit-Fe Fumarate-FA ([facility] PRENATAL) 28-0.8 MG TABS Take by mouth. 12/21/16  Yes [provider]    Physical Exam Vitals: Blood pressure 120/82, height 5\' 3"  (1.6 m), weight 151 lb (68.5 kg), last menstrual period 06/27/2020, unknown if currently breastfeeding.  General: NAD HEENT: normocephalic, anicteric Thyroid: no enlargement, no palpable nodules Pulmonary: No increased work of breathing, CTAB Cardiovascular: RRR, distal pulses 2+ Breast: Breast symmetrical, no tenderness, no palpable nodules or masses, no skin or nipple retraction present, no nipple discharge.  No axillary or supraclavicular lymphadenopathy. Abdomen: NABS, soft, non-tender, non-distended.  Umbilicus without lesions.  No hepatomegaly, splenomegaly or masses palpable. No evidence of hernia  Genitourinary:  External: Normal external female  genitalia.  Normal urethral meatus, normal Bartholin's and Skene's glands.    Vagina: Normal vaginal mucosa, no evidence of prolapse.    Cervix: Grossly normal in appearance, no bleeding  Uterus: Non-enlarged, mobile, normal contour.  No CMT  Adnexa: ovaries non-enlarged, no adnexal masses  Rectal: deferred  Lymphatic: no evidence of inguinal lymphadenopathy Extremities: no edema, erythema, or  tenderness Neurologic: Grossly intact Psychiatric: mood appropriate, affect full  Female chaperone present for pelvic and breast  portions of the physical exam    Assessment: 44 y.o. [id number] routine annual exam  Plan: Problem List Items Addressed This Visit   None   Visit Diagnoses    Screening for cervical cancer    -  Primary   Relevant Orders   Cytology - PAP   Immunization counseling       Encounter for gynecological examination without abnormal finding       Routine health maintenance       Screening breast examination          1) Mammogram - recommend yearly screening mammogram.  Mammogram ordered by PCP - patient plans to get within this week   2) STI screening  wasoffered and declined  3) ASCCP guidelines and rational discussed.  Patient opts for every 3 years screening interval  4) Contraception - the patient is currently using  none.  She is desiring pregnancy - IVF scheduled for March 2022.  5) Colonoscopy -- Screening recommended starting at age 42 for average risk individuals, age 73 for individuals deemed at increased risk (including African Americans) and recommended to continue until age 54.  For patient age 22-85 individualized approach is recommended.  Gold standard screening is via colonoscopy, Cologuard screening is an acceptable alternative for patient unwilling or unable to undergo colonoscopy.  "Colorectal cancer screening for average?risk adults: 2018 guideline update from the American Cancer Society"[place]: A Cancer Journal for Clinicians: Dec 22, 2016   6) Routine healthcare maintenance including cholesterol, diabetes screening discussed managed by PCP  7) RTC as needed or for annual next year  Orlie Pollen, CNM, MSN [facility], [place] 07/21/2020, 4:00 PM

## 2020-07-23 ENCOUNTER — Ambulatory Visit
Admission: RE | Admit: 2020-07-23 | Discharge: 2020-07-23 | Disposition: A | Discharge status: Home / Self Care | Payer: BC Managed Care – PPO | Source: Ambulatory Visit | Attending: Family Medicine | Admitting: Family Medicine

## 2020-07-23 ENCOUNTER — Other Ambulatory Visit: Payer: Self-pay

## 2020-07-23 DIAGNOSIS — R079 Chest pain, unspecified: Secondary | ICD-10-CM

## 2020-07-23 LAB — CYTOLOGY - PAP
Comment: NEGATIVE
Diagnosis: NEGATIVE
High risk HPV: NEGATIVE

## 2020-09-15 LAB — FETAL NONSTRESS TEST

## 2021-02-17 ENCOUNTER — Other Ambulatory Visit: Payer: Self-pay | Admitting: Obstetrics & Gynecology

## 2021-02-17 NOTE — Telephone Encounter (Signed)
Patient is schedulec for 03/02/21 at 11:40 with [facility]

## 2021-03-02 ENCOUNTER — Encounter: Payer: Self-pay | Admitting: Obstetrics & Gynecology

## 2021-03-02 ENCOUNTER — Ambulatory Visit (INDEPENDENT_AMBULATORY_CARE_PROVIDER_SITE_OTHER): Payer: BC Managed Care – PPO | Admitting: Obstetrics & Gynecology

## 2021-03-02 ENCOUNTER — Other Ambulatory Visit (HOSPITAL_COMMUNITY)
Admission: RE | Admit: 2021-03-02 | Discharge: 2021-03-02 | Disposition: A | Discharge status: Home / Self Care | Payer: BC Managed Care – PPO | Source: Ambulatory Visit | Attending: Obstetrics & Gynecology | Admitting: Obstetrics & Gynecology

## 2021-03-02 VITALS — BP 120/70 | Wt 156.0 lb

## 2021-03-02 DIAGNOSIS — Z3689 Encounter for other specified antenatal screening: Secondary | ICD-10-CM

## 2021-03-02 DIAGNOSIS — Z124 Encounter for screening for malignant neoplasm of cervix: Secondary | ICD-10-CM | POA: Diagnosis not present

## 2021-03-02 DIAGNOSIS — O0991 Supervision of high risk pregnancy, unspecified, first trimester: Secondary | ICD-10-CM

## 2021-03-02 DIAGNOSIS — Z3A10 10 weeks gestation of pregnancy: Secondary | ICD-10-CM

## 2021-03-02 DIAGNOSIS — Z1379 Encounter for other screening for genetic and chromosomal anomalies: Secondary | ICD-10-CM

## 2021-03-02 DIAGNOSIS — O09521 Supervision of elderly multigravida, first trimester: Secondary | ICD-10-CM

## 2021-03-02 DIAGNOSIS — Z369 Encounter for antenatal screening, unspecified: Secondary | ICD-10-CM

## 2021-03-02 NOTE — Patient Instructions (Signed)
Thank you for choosing [facility]. As part of our ongoing efforts to improve patient experience, we would appreciate your feedback. Please fill out the short survey that you will receive by mail or MyChart. Your opinion is important to [place]! -Dr Kenton Kingfisher  Commonly Asked Questions During Pregnancy  Cats: A parasite can be excreted in cat feces.  To avoid exposure you need to have another person empty the little box.  If you must empty the litter box you will need to wear gloves.  Wash your hands after handling your cat.  This parasite can also be found in raw or undercooked meat so this should also be avoided.  Colds, Sore Throats, Flu: Please check your medication sheet to see what you can take for symptoms.  If your symptoms are unrelieved by these medications please call the office.  Dental Work: Most any dental work Investment banker, corporate recommends is permitted.  X-rays should only be taken during the first trimester if absolutely necessary.  Your abdomen should be shielded with a lead apron during all x-rays.  Please notify your provider prior to receiving any x-rays.  Novocaine is fine; gas is not recommended.  If your dentist requires a note from [place] prior to dental work please call the office and we will provide one for you.  Exercise: Exercise is an important part of staying healthy during your pregnancy.  You may continue most exercises you were accustomed to prior to pregnancy.  Later in your pregnancy you will most likely notice you have difficulty with activities requiring balance like riding a bicycle.  It is important that you listen to your body and avoid activities that put you at a higher risk of falling.  Adequate rest and staying well hydrated are a must!  If you have questions about the safety of specific activities ask your provider.    Exposure to Children with illness: Try to avoid obvious exposure; report any symptoms to [place] when noted,  If you have chicken pos, red measles or mumps, you  should be immune to these diseases.   Please do not take any vaccines while pregnant unless you have checked with your OB provider.  Fetal Movement: After 28 weeks we recommend you do "kick counts" twice daily.  Lie or sit down in a calm quiet environment and count your baby movements "kicks".  You should feel your baby at least 10 times per hour.  If you have not felt 10 kicks within the first hour get up, walk around and have something sweet to eat or drink then repeat for an additional hour.  If count remains less than 10 per hour notify your provider.  Fumigating: Follow your pest control agent's advice as to how long to stay out of your home.  Ventilate the area well before re-entering.  Hemorrhoids:   Most over-the-counter preparations can be used during pregnancy.  Check your medication to see what is safe to use.  It is important to use a stool softener or fiber in your diet and to drink lots of liquids.  If hemorrhoids seem to be getting worse please call the office.   Hot Tubs:  Hot tubs Jacuzzis and saunas are not recommended while pregnant.  These increase your internal body temperature and should be avoided.  Intercourse:  Sexual intercourse is safe during pregnancy as long as you are comfortable, unless otherwise advised by your provider.  Spotting may occur after intercourse; report any bright red bleeding that is heavier than spotting.  Labor:  If you know that you are in labor, please go to the hospital.  If you are unsure, please call the office and let us help you decide what to do.  Lifting, straining, etc:  If your job requires heavy lifting or straining please check with your provider for any limitations.  Generally, you should not lift items heavier than that you can lift simply with your hands and arms (no back muscles)  Painting:  Paint fumes do not harm your pregnancy, but may make you ill and should be avoided if possible.  Latex or water based paints have less odor than  oils.  Use adequate ventilation while painting.  Permanents & Hair Color:  Chemicals in hair dyes are not recommended as they cause increase hair dryness which can increase hair loss during pregnancy.  " Highlighting" and permanents are allowed.  Dye may be absorbed differently and permanents may not hold as well during pregnancy.  Sunbathing:  Use a sunscreen, as skin burns easily during pregnancy.  Drink plenty of fluids; avoid over heating.  Tanning Beds:  Because their possible side effects are still unknown, tanning beds are not recommended.  Ultrasound Scans:  Routine ultrasounds are performed at approximately 20 weeks.  You will be able to see your baby's general anatomy an if you would like to know the gender this can usually be determined as well.  If it is questionable when you conceived you may also receive an ultrasound early in your pregnancy for dating purposes.  Otherwise ultrasound exams are not routinely performed unless there is a medical necessity.  Although you can request a scan we ask that you pay for it when conducted because insurance does not cover " patient request" scans.  Work: If your pregnancy proceeds without complications you may work until your due date, unless your physician or employer advises otherwise.  Round Ligament Pain/Pelvic Discomfort:  Sharp, shooting pains not associated with bleeding are fairly common, usually occurring in the second trimester of pregnancy.  They tend to be worse when standing up or when you remain standing for long periods of time.  These are the result of pressure of certain pelvic ligaments called "round ligaments".  Rest, Tylenol and heat seem to be the most effective relief.  As the womb and fetus grow, they rise out of the pelvis and the discomfort improves.  Please notify the office if your pain seems different than that described.  It may represent a more serious condition.

## 2021-03-02 NOTE — Progress Notes (Signed)
03/02/2021   Chief Complaint: Missed period  Transfer of Care Patient: REI referral after donor egg transfer and ultrasound confirmations  History of Present Illness: Angela [name] is a 45 y.o. [id number] 29w3dbased on Patient's last menstrual period was 12/19/2020. with an Estimated Date of Delivery: 09/25/21, with the above CC.   Her periods were: regular periods every 28 days She was using no method when she conceived.  She has Positive signs or symptoms of nausea/vomiting of pregnancy. She has Negative signs or symptoms of miscarriage or preterm labor She identifies Positive Zika risk factors for her and her partner On any different medications around the time she conceived/early pregnancy: Yes - Progesterone thru 10 weeks for fertility History of varicella: Yes   ROS: A 12-point review of systems was performed and negative, except as stated in the above HPI.  OBGYN History: As per HPI. 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 Last pap smear 2020. Abnormal: no  History of STIs: No   Past Medical History: History reviewed. No pertinent past medical history.  Past Surgical History: Past Surgical History:  Procedure Laterality Date  . AUGMENTATION MAMMAPLASTY Bilateral 20000000  silicone per pt  . BREAST SURGERY      Family History:  Family History  Problem Relation Age of Onset  . Breast cancer Neg Hx    She denies any female cancers, bleeding or blood clotting disorders.  She denies any  history of mental retardation, birth defects or genetic disorders in her or the FOB's history  Social History:  Social History   Socioeconomic History  . Marital status: Married    Spouse name: Not on file  . Number of children: Not on file  . Years of education: Not on file  . Highest education level: Not on file  Occupational History  . Not on file  Tobacco Use  . Smoking status: Never  . Smokeless tobacco: Never  Vaping Use  . Vaping Use: Never used  Substance and Sexual Activity  . Alcohol use: Never  . Drug use: Never  . Sexual activity: Not Currently  Other Topics Concern  . Not on file  Social History Narrative  . Not on file   Social Determinants of Health   Financial Resource Strain: Not on file  Food Insecurity: Not on file  Transportation Needs: Not on file  Physical Activity: Not on file  Stress: Not on file  Social Connections: Not on file  Intimate Partner Violence: Not on file   Any pets in the household: yes Dogs  Allergy: No Known Allergies  Current Outpatient Medications:  Current Outpatient Medications:  .  Prenatal Vit-Fe Fumarate-FA ([facility] PRENATAL) 28-0.8 MG  TABS, Take by mouth., Disp: , Rfl:    Physical Exam:   BP 120/70   Wt 156 lb (70.8 kg)   LMP 12/19/2020   BMI 27.63 kg/m  Body mass index is 27.63 kg/m. Constitutional: Well nourished, well developed female in no acute distress.  Neck:  Supple, normal appearance, and no thyromegaly  Cardiovascular: S1, S2 normal, no murmur, rub or gallop, regular rate and rhythm Respiratory:  Clear to auscultation bilateral. Normal respiratory effort Abdomen: positive bowel sounds and no masses, hernias; diffusely non tender to palpation, non distended Breasts: breasts appear normal, no suspicious masses, no skin or nipple changes or axillary nodes. Neuro/Psych:  Normal mood and affect.  Skin:  Warm and dry.  Lymphatic:  No inguinal lymphadenopathy.   Pelvic exam: is not limited by body  habitus EGBUS: within normal limits, Vagina: within normal limits and with no blood in the vault, Cervix: normal appearing cervix without discharge or lesions, closed/long/high, Uterus:  enlarged: 10 weeks, and Adnexa:  normal adnexa  [place]- FHT 140s    FM +    CRL c/w dates [facility] 09/20/21)  Assessment: Angela [name] is a 45 y.o. [id number] 34w3dbased on Patient's last menstrual period was 12/19/2020. with an Estimated Date of Delivery: 09/25/21,  for prenatal care.  Keep EDC of 09/25/21  Plan:  1) Avoid alcoholic beverages. 2) Patient encouraged not to smoke.  3) Discontinue the use of all non-medicinal drugs and chemicals.  4) Take prenatal vitamins daily.  5) Seatbelt use advised 6) Nutrition, food safety (fish, cheese advisories, and high nitrite foods) and exercise discussed. 7) [facility]  8) Patient is asked about travel to areas at risk for the ZQueen Annevirus, and counseled to avoid travel and exposure to mosquitoes or sexual partners who may have themselves been exposed to the virus. Testing is discussed, and will be ordered as appropriate.  9) [facility] 10) Genetic Screening, such as with 1st Trimester Screening, cell free fetal DNA, AFP testing, and Ultrasound, as well as with amniocentesis and CVS as appropriate, is discussed with patient. She plans to have genetic testing this pregnancy. 11) Donor egg so no Inheritest 12) MFM [place]19 weeks 13) Prior birth trauma so desires CS at term 187 AMA risks discussed 15) Labs today Problem list reviewed and updated.  PBarnett Applebaum MD, FLoura PardonOb/Gyn, [facility] 03/02/2021  12:53 PM

## 2021-03-04 LAB — CYTOLOGY - PAP
Chlamydia: NEGATIVE
Comment: NEGATIVE
Comment: NORMAL
Diagnosis: NEGATIVE
Neisseria Gonorrhea: NEGATIVE

## 2021-03-04 LAB — URINE CULTURE: Organism ID, Bacteria: NO GROWTH

## 2021-03-07 LAB — MATERNIT21 PLUS CORE+SCA
Fetal Fraction: 8
Monosomy X (Turner Syndrome): NOT DETECTED
Result (T21): NEGATIVE
Trisomy 13 (Patau syndrome): NEGATIVE
Trisomy 18 (Edwards syndrome): NEGATIVE
Trisomy 21 (Down syndrome): NEGATIVE
XXX (Triple X Syndrome): NOT DETECTED
XXY (Klinefelter Syndrome): NOT DETECTED
XYY (Jacobs Syndrome): NOT DETECTED

## 2021-03-07 LAB — RPR+RH+ABO+RUB AB+AB SCR+CB...
Antibody Screen: NEGATIVE
HIV Screen 4th Generation wRfx: NONREACTIVE
Hematocrit: 36.7 % (ref 34.0–46.6)
Hemoglobin: 11.9 g/dL (ref 11.1–15.9)
Hepatitis B Surface Ag: NEGATIVE
MCH: 30.2 pg (ref 26.6–33.0)
MCHC: 32.4 g/dL (ref 31.5–35.7)
MCV: 93 fL (ref 79–97)
Platelets: 365 10*3/uL (ref 150–450)
RBC: 3.94 x10E6/uL (ref 3.77–5.28)
RDW: 12.7 % (ref 11.7–15.4)
RPR Ser Ql: REACTIVE — AB
Rh Factor: POSITIVE
Rubella Antibodies, IGG: 10.8 index (ref >0.99)
Varicella zoster IgG: 1039 index (ref >165)
WBC: 11.2 10*3/uL — ABNORMAL HIGH (ref 3.4–10.8)

## 2021-03-07 LAB — RPR, QUANT+TP ABS (REFLEX)
Rapid Plasma Reagin, Quant: 1:1 {titer} — ABNORMAL HIGH
T Pallidum Abs: NONREACTIVE

## 2021-03-07 LAB — HEPATITIS C ANTIBODY: Hep C Virus Ab: <0.1 s/co ratio (ref 0.0–0.9)

## 2021-03-09 ENCOUNTER — Telehealth: Payer: Self-pay | Admitting: Obstetrics & Gynecology

## 2021-03-09 NOTE — Telephone Encounter (Signed)
Patient's husband Cecilie Lowers calling back for [facility]. Stated he was needing to go over some lab results.   CB# [phone]

## 2021-03-26 DIAGNOSIS — Z8041 Family history of malignant neoplasm of ovary: Secondary | ICD-10-CM

## 2021-03-26 HISTORY — DX: Family history of malignant neoplasm of ovary: Z80.41

## 2021-04-06 ENCOUNTER — Other Ambulatory Visit: Payer: Self-pay

## 2021-04-06 ENCOUNTER — Encounter: Payer: Self-pay | Admitting: Obstetrics & Gynecology

## 2021-04-06 ENCOUNTER — Ambulatory Visit (INDEPENDENT_AMBULATORY_CARE_PROVIDER_SITE_OTHER): Payer: BC Managed Care – PPO | Admitting: Obstetrics & Gynecology

## 2021-04-06 VITALS — BP 100/70 | Wt 165.0 lb

## 2021-04-06 DIAGNOSIS — Z3A15 15 weeks gestation of pregnancy: Secondary | ICD-10-CM

## 2021-04-06 DIAGNOSIS — O0992 Supervision of high risk pregnancy, unspecified, second trimester: Secondary | ICD-10-CM

## 2021-04-06 DIAGNOSIS — O0993 Supervision of high risk pregnancy, unspecified, third trimester: Secondary | ICD-10-CM | POA: Insufficient documentation

## 2021-04-06 DIAGNOSIS — O09522 Supervision of elderly multigravida, second trimester: Secondary | ICD-10-CM | POA: Insufficient documentation

## 2021-04-06 LAB — POCT URINALYSIS DIPSTICK OB
Glucose, UA: NEGATIVE
POC,PROTEIN,UA: NEGATIVE

## 2021-04-06 NOTE — Progress Notes (Signed)
  Subjective  Min nausea, no pain or bleeding  Objective  BP 100/70   Wt 165 lb (74.8 kg)   LMP 12/19/2020   BMI 29.23 kg/m  General: NAD Pumonary: no increased work of breathing Abdomen: gravid, non-tender Extremities: no edema Psychiatric: mood appropriate, affect full  Assessment  45 y.o. [id number] at 56w3dby  09/25/2021, by Last Menstrual Period presenting for routine prenatal visit  Plan   Problem List Items Addressed This Visit    HRP (high risk pregnancy), second trimester - Primary   Multigravida of advanced maternal age in second trimester   [redacted] weeks gestation of pregnancy        PNV [place]FM 19 weeks scheduled Plans CS due to prior birth trauma Labs discussed  PBarnett Applebaum MD, FLoura PardonOb/Gyn, [facility] 04/06/2021  11:13 AM

## 2021-04-06 NOTE — Patient Instructions (Signed)

## 2021-04-06 NOTE — Addendum Note (Signed)
Addended by: Quintella Baton D on: 04/06/2021 11:16 AM   Modules accepted: Orders

## 2021-04-07 ENCOUNTER — Encounter: Payer: Self-pay | Admitting: Obstetrics and Gynecology

## 2021-04-09 ENCOUNTER — Encounter: Payer: Self-pay | Admitting: Emergency Medicine

## 2021-04-09 ENCOUNTER — Other Ambulatory Visit: Payer: Self-pay

## 2021-04-09 ENCOUNTER — Emergency Department
Admission: EM | Admit: 2021-04-09 | Discharge: 2021-04-09 | Disposition: A | Discharge status: Home / Self Care | Payer: BC Managed Care – PPO | Attending: Emergency Medicine | Admitting: Emergency Medicine

## 2021-04-09 ENCOUNTER — Emergency Department: Payer: BC Managed Care – PPO

## 2021-04-09 DIAGNOSIS — Z3A15 15 weeks gestation of pregnancy: Secondary | ICD-10-CM

## 2021-04-09 DIAGNOSIS — N898 Other specified noninflammatory disorders of vagina: Secondary | ICD-10-CM | POA: Diagnosis not present

## 2021-04-09 DIAGNOSIS — Z85828 Personal history of other malignant neoplasm of skin: Secondary | ICD-10-CM | POA: Insufficient documentation

## 2021-04-09 DIAGNOSIS — O209 Hemorrhage in early pregnancy, unspecified: Secondary | ICD-10-CM | POA: Diagnosis not present

## 2021-04-09 DIAGNOSIS — N939 Abnormal uterine and vaginal bleeding, unspecified: Secondary | ICD-10-CM

## 2021-04-09 DIAGNOSIS — O4692 Antepartum hemorrhage, unspecified, second trimester: Secondary | ICD-10-CM | POA: Diagnosis not present

## 2021-04-09 DIAGNOSIS — O4690 Antepartum hemorrhage, unspecified, unspecified trimester: Secondary | ICD-10-CM

## 2021-04-09 DIAGNOSIS — Z3A17 17 weeks gestation of pregnancy: Secondary | ICD-10-CM | POA: Insufficient documentation

## 2021-04-09 LAB — CBC WITH DIFFERENTIAL/PLATELET
Abs Immature Granulocytes: 0.06 10*3/uL (ref 0.00–0.07)
Basophils Absolute: 0 10*3/uL (ref 0.0–0.1)
Basophils Relative: 0 %
Eosinophils Absolute: 0.1 10*3/uL (ref 0.0–0.5)
Eosinophils Relative: 1 %
HCT: 34.8 % — ABNORMAL LOW (ref 36.0–46.0)
Hemoglobin: 12.1 g/dL (ref 12.0–15.0)
Immature Granulocytes: 1 %
Lymphocytes Relative: 18 %
Lymphs Abs: 1.9 10*3/uL (ref 0.7–4.0)
MCH: 32.4 pg (ref 26.0–34.0)
MCHC: 34.8 g/dL (ref 30.0–36.0)
MCV: 93.3 fL (ref 80.0–100.0)
Monocytes Absolute: 0.5 10*3/uL (ref 0.1–1.0)
Monocytes Relative: 5 %
Neutro Abs: 7.8 10*3/uL — ABNORMAL HIGH (ref 1.7–7.7)
Neutrophils Relative %: 75 %
Platelets: 378 10*3/uL (ref 150–400)
RBC: 3.73 MIL/uL — ABNORMAL LOW (ref 3.87–5.11)
RDW: 13.2 % (ref 11.5–15.5)
WBC: 10.5 10*3/uL (ref 4.0–10.5)
nRBC: 0 % (ref 0.0–0.2)

## 2021-04-09 LAB — POC URINE PREG, ED: Preg Test, Ur: POSITIVE — AB

## 2021-04-09 LAB — BASIC METABOLIC PANEL
Anion gap: 6 (ref 5–15)
BUN: 12 mg/dL (ref 6–20)
CO2: 26 mmol/L (ref 22–32)
Calcium: 9 mg/dL (ref 8.9–10.3)
Chloride: 104 mmol/L (ref 98–111)
Creatinine, Ser: 0.51 mg/dL (ref 0.44–1.00)
GFR, Estimated: >60 mL/min (ref >60)
Glucose, Bld: 87 mg/dL (ref 70–99)
Potassium: 3.9 mmol/L (ref 3.5–5.1)
Sodium: 136 mmol/L (ref 135–145)

## 2021-04-09 LAB — HCG, QUANTITATIVE, PREGNANCY: hCG, Beta Chain, Quant, S: 67135 m[IU]/mL — ABNORMAL HIGH (ref <5)

## 2021-04-09 LAB — ABO/RH: ABO/RH(D): O POS

## 2021-04-09 NOTE — Consult Note (Signed)
Obstetrics & Gynecology Note  Date of Consultation: 04/09/2021   Requesting Provider: [facility]  Reason for Consultation: Second trimester bleeding  History of Present Illness: Angela [name] is a 45 y.o. [phone] (Patient's last menstrual period was 12/19/2020.), with the above CC. She is 15 weeks.  Donor egg, IVF.  Small amount of bleeding last night and today; dark/maroon.  No pain or associated sx's.  ROS: A review of systems was performed and was complete and comprehensive, except as stated in the above HPI.  OBGYN History: As per HPI. OB History     Gravida  4   Para  1   Term  1   Preterm      AB  2   Living  1      SAB  2   IAB      Ectopic      Multiple      Live Births               Past Medical History: Past Medical History:  Diagnosis Date   Basal cell carcinoma 01/20/2016   R temple   Family history of ovarian cancer 03/2021   cancer genetic testing letter sent    Past Surgical History: Past Surgical History:  Procedure Laterality Date   AUGMENTATION MAMMAPLASTY Bilateral 0000000   silicone per pt   BREAST SURGERY      Family History:  Family History  Problem Relation Age of Onset   Ovarian cancer Paternal Grandmother 61   Breast cancer Neg Hx    She denies any female cancers, bleeding or blood clotting disorders.   Social History:  Social History   Socioeconomic History   Marital status: Married    Spouse name: Not on file   Number of children: Not on file   Years of education: Not on file   Highest education level: Not on file  Occupational History   Not on file  Tobacco Use   Smoking status: Never   Smokeless tobacco: Never  Vaping Use   Vaping Use: Never used  Substance and Sexual Activity   Alcohol use: Never   Drug use: Never   Sexual activity: Not Currently  Other Topics Concern   Not on file  Social History Narrative   Not on file   Social Determinants of Health   Financial Resource Strain: Not on file  Food  Insecurity: Not on file  Transportation Needs: Not on file  Physical Activity: Not on file  Stress: Not on file  Social Connections: Not on file  Intimate Partner Violence: Not on file    Allergy: No Known Allergies  Current Outpatient Medications: (Not in a hospital admission)   Hospital Medications: No current facility-administered medications for this encounter.   Current Outpatient Medications  Medication Sig Dispense Refill   Prenatal Vit-Fe Fumarate-FA ([facility] PRENATAL) 28-0.8 MG TABS Take by mouth.      Physical Exam: Vitals:   04/09/21 1007 04/09/21 1014 04/09/21 1439  BP:  111/77 112/81  Pulse:  77 73  Resp:  18 14  Temp:  98.2 F (36.8 C) 98.6 F (37 C)  TempSrc:  Oral Oral  SpO2:  100% 100%  Weight: 74.8 kg    Height: '5\' 3"'$  (1.6 m)      Body mass index is 29.21 kg/m. Physical Exam Constitutional:      General: She is not in acute distress.    Appearance: She is well-developed.  Musculoskeletal:  General: Normal range of motion.  Neurological:     Mental Status: She is alert and oriented to person, place, and time.  Skin:    General: Skin is warm and dry.  Vitals reviewed.    Recent Labs  Lab 04/09/21 1008  WBC 10.5  HGB 12.1  HCT 34.8*  PLT 378   Recent Labs  Lab 04/09/21 1008  NA 136  K 3.9  CL 104  CO2 26  BUN 12  CREATININE 0.51  CALCIUM 9.0  GLUCOSE 87   No results for input(s): APTT, INR, PTT in the last 168 hours.  Invalid input(s): DRHAPTT Recent Labs  Lab 04/09/21 1008  [place] Performed at [facility], [address] [place], [place] [address]     Imaging:  Ultrasound independently reviewed/interpreted by self. Small area of bleeding; question of "abruption" Reassuring FHT and fetal growth compared to prior scan  Assessment: [name] is a 45 y.o. [phone] (Patient's last menstrual period was 12/19/2020.) who presented to the ED with complaints of second trimester bleeding; findings are  consistent with reassuring fetal status w small bleed of unclear etiology.  No immediate concern for pregnancy loss, previa, cervical incompetence.  Plan: Pelvic rest and close monitoring of bleeding Follow up as outpatient  Barnett Applebaum, MD, [facility], [facility] 04/09/2021  3:39 PM Pager [phone]

## 2021-04-09 NOTE — ED Provider Notes (Signed)
[facility] Emergency Department Provider Note  ____________________________________________   Event Date/Time   First MD Initiated Contact with Patient 04/09/21 1214     (approximate)  I have reviewed the triage vital signs and the nursing notes.   HISTORY  Chief Complaint Vaginal Bleeding    HPI Angela [name] is a 45 y.o. female presents emergency department with vaginal bleeding.  Patient will be [redacted] weeks pregnant tomorrow.  Started having spotting last night, a little darker blood this morning.  No heavy bleeding.  No cramping no pain.  Patient is followed by Dr. Kenton Kingfisher  Past Medical History:  Diagnosis Date   Basal cell carcinoma 01/20/2016   R temple   Family history of ovarian cancer 03/2021   cancer genetic testing letter sent    Patient Active Problem List   Diagnosis Date Noted   HRP (high risk pregnancy), second trimester 04/06/2021   Multigravida of advanced maternal age in second trimester 04/06/2021    Past Surgical History:  Procedure Laterality Date   AUGMENTATION MAMMAPLASTY Bilateral 0000000   silicone per pt   BREAST SURGERY      Prior to Admission medications   Medication Sig Start Date End Date Taking? Authorizing Provider  Prenatal Vit-Fe Fumarate-FA ([facility]S PRENATAL) 28-0.8 MG TABS Take by mouth. 12/21/16   [provider]    Allergies Patient has no known allergies.  Family History  Problem Relation Age of Onset   Ovarian cancer Paternal Grandmother 36   Breast cancer Neg Hx     Social History Social History   Tobacco Use   Smoking status: Never   Smokeless tobacco: Never  Vaping Use   Vaping Use: Never used  Substance Use Topics   Alcohol use: Never   Drug use: Never    Review of Systems  Constitutional: No fever/chills Eyes: No visual changes. ENT: No sore throat. Respiratory: Denies cough Cardiovascular: Denies chest pain Gastrointestinal: Denies abdominal pain Genitourinary:  Negative for dysuria. Musculoskeletal: Negative for back pain. Skin: Negative for rash. Psychiatric: no mood changes,     ____________________________________________   PHYSICAL EXAM:  VITAL SIGNS: ED Triage Vitals  Enc Vitals Group     BP 04/09/21 1014 111/77     Pulse Rate 04/09/21 1014 77     Resp 04/09/21 1014 18     Temp 04/09/21 1014 98.2 F (36.8 C)     Temp Source 04/09/21 1014 Oral     SpO2 04/09/21 1014 100 %     Weight 04/09/21 1007 164 lb 14.5 oz (74.8 kg)     Height 04/09/21 1007 '5\' 3"'$  (1.6 m)     Head Circumference --      Peak Flow --      Pain Score 04/09/21 1006 0     Pain Loc --      Pain Edu? --      Excl. in [place]? --     Constitutional: Alert and oriented. Well appearing and in no acute distress. Eyes: Conjunctivae are normal.  Head: Atraumatic. Nose: No congestion/rhinnorhea. Mouth/Throat: Mucous membranes are moist.   Neck:  supple no lymphadenopathy noted Cardiovascular: Normal rate, regular rhythm.  Respiratory: Normal respiratory effort.  No retractions,  Abd: soft nontender bs normal all 4 quad GU: deferred Musculoskeletal: FROM all extremities, warm and well perfused Neurologic:  Normal speech and language.  Skin:  Skin is warm, dry and intact. No rash noted. Psychiatric: Mood and affect are normal. Speech and behavior are normal.  ____________________________________________   LABS (all labs ordered are listed, but only abnormal results are displayed)  Labs Reviewed  HCG, QUANTITATIVE, PREGNANCY - Abnormal; Notable for the following components:      Result Value   hCG, Beta Chain, Quant, S 67,135 (*)    All other components within normal limits  CBC WITH DIFFERENTIAL/PLATELET - Abnormal; Notable for the following components:   RBC 3.73 (*)    HCT 34.8 (*)    Neutro Abs 7.8 (*)    All other components within normal limits  POC URINE PREG, ED - Abnormal; Notable for the following components:   Preg Test, Ur POSITIVE (*)    All  other components within normal limits  BASIC METABOLIC PANEL  ABO/RH   ____________________________________________   ____________________________________________  RADIOLOGY  Ultrasound OB greater than 14 weeks  ____________________________________________   PROCEDURES  Procedure(s) performed: No  Procedures    ____________________________________________   INITIAL IMPRESSION / ASSESSMENT AND PLAN / ED COURSE  Pertinent labs & imaging results that were available during my care of the patient were reviewed by me and considered in my medical decision making (see chart for details).   Patient's 45 year old female presents emergency department with vaginal bleeding in pregnancy.  See HPI.  Physical exam shows patient is stable  DDx: Subchorionic hemorrhage, vaginal bleeding pregnancy, placental abruption, miscarriage  CBC is normal, ABO/Rh is O+, basic metabolic panel is normal, beta-hCG is 67,135, POC pregnancy is positive  Ultrasound OB greater than 14 weeks shows a possible minimal placenta abruption.  Reviewed by me confirmed by radiology  Consult to OB/GYN.  Dr. Kenton Kingfisher will be in to see the patient  After Dr. Kenton Kingfisher saw the patient he states she can be discharged home.  She was discharged stable condition.     Angela [name] was evaluated in Emergency Department on 04/09/2021 for the symptoms described in the history of present illness. She was evaluated in the context of the global COVID-19 pandemic, which necessitated consideration that the patient might be at risk for infection with the SARS-CoV-2 virus that causes COVID-19. Institutional protocols and algorithms that pertain to the evaluation of patients at risk for COVID-19 are in a state of rapid change based on information released by regulatory bodies including the CDC and federal and state organizations. These policies and algorithms were followed during the patient's care in the ED.    As part of my  medical decision making, I reviewed the following data within the [place] notes reviewed and incorporated, Labs reviewed , Old chart reviewed, Radiograph reviewed , Notes from prior ED visits, and [place] Controlled Substance Database  ____________________________________________   FINAL CLINICAL IMPRESSION(S) / ED DIAGNOSES  Final diagnoses:  Vaginal bleeding      NEW MEDICATIONS STARTED DURING THIS VISIT:  New Prescriptions   No medications on file     Note:  This document was prepared using Dragon voice recognition software and may include unintentional dictation errors.    Versie Starks, PA-C 04/09/21 1606    Duffy Bruce, MD 04/10/21 [phone]

## 2021-04-09 NOTE — ED Triage Notes (Signed)
Pt comes into the ED via POV c/o vaginal bleeding.  Pt will be 16 weeks tomorrow.  Pt states the spotting started yesterday but it increased this morning.  Pt had fetal heart tones done this morning with a rate of 151.  Pt in NAd at this time.

## 2021-04-14 ENCOUNTER — Other Ambulatory Visit: Payer: Self-pay | Admitting: Obstetrics & Gynecology

## 2021-04-14 ENCOUNTER — Ambulatory Visit (INDEPENDENT_AMBULATORY_CARE_PROVIDER_SITE_OTHER): Payer: BC Managed Care – PPO | Admitting: Obstetrics & Gynecology

## 2021-04-14 ENCOUNTER — Encounter: Payer: Self-pay | Admitting: Obstetrics & Gynecology

## 2021-04-14 ENCOUNTER — Other Ambulatory Visit: Payer: Self-pay

## 2021-04-14 VITALS — BP 110/70 | Wt 168.0 lb

## 2021-04-14 DIAGNOSIS — N898 Other specified noninflammatory disorders of vagina: Secondary | ICD-10-CM | POA: Diagnosis not present

## 2021-04-14 DIAGNOSIS — O26892 Other specified pregnancy related conditions, second trimester: Secondary | ICD-10-CM

## 2021-04-14 DIAGNOSIS — O4592 Premature separation of placenta, unspecified, second trimester: Secondary | ICD-10-CM

## 2021-04-14 NOTE — Progress Notes (Signed)
  HPI:      Ms. DERIN MATTHES is a 45 y.o. [id number] who LMP was Patient's last menstrual period was 12/19/2020., presents today for a problem visit.  She complains of:  Patient complains of an abnormal vaginal discharge for 2 days. Vaginal symptoms include  clear watery discharge off an on.   No bleeding.  No fever. No pain.  PMHx: She  has a past medical history of Basal cell carcinoma (01/20/2016) and Family history of ovarian cancer (03/2021). Also,  has a past surgical history that includes Breast surgery and Augmentation mammaplasty (Bilateral, 2013)., family history includes Ovarian cancer (age of onset: 66) in her paternal grandmother.,  reports that she has never smoked. She has never used smokeless tobacco. She reports that she does not drink alcohol and does not use drugs.  She has a current medication list which includes the following prescription(s): [facility]. Also, has No Known Allergies.  Review of Systems  All other systems reviewed and are negative.  Objective: BP 110/70   Wt 168 lb (76.2 kg)   LMP 12/19/2020   BMI 29.76 kg/m  Physical Exam Constitutional:      General: She is not in acute distress.    Appearance: She is well-developed.  Genitourinary:     Right Labia: No rash or tenderness.    Left Labia: No tenderness or rash.    No vaginal erythema or bleeding.      Right Adnexa: not tender and no mass present.    Left Adnexa: not tender and no mass present.    No cervical motion tenderness, discharge, polyp or nabothian cyst.     Cervical exam comments: Cervix not dilated.  Mucus at os.  No watery fluid or pooling noted.Marland Kitchen     Uterus is not enlarged.     No uterine mass detected.    Pelvic exam was performed with patient in the lithotomy position.  HENT:     Head: Normocephalic and atraumatic.     Nose: Nose normal.  Abdominal:     General: There is no distension.     Palpations: Abdomen is soft.     Tenderness: There is no abdominal tenderness.      Comments: FHT 150s  Musculoskeletal:        General: Normal range of motion.  Neurological:     Mental Status: She is alert and oriented to person, place, and time.     Cranial Nerves: No cranial nerve deficit.  Skin:    General: Skin is warm and dry.  Psychiatric:        Attention and Perception: Attention normal.        Mood and Affect: Mood and affect normal.        Speech: Speech normal.        Behavior: Behavior normal.        Thought Content: Thought content normal.        Judgment: Judgment normal.   FERN NEG  ASSESSMENT/PLAN:    Problem List Items Addressed This Visit     Vaginal discharge during pregnancy in second trimester    -  Primary   No s/sx PPROM or infection Monitor discharge [place] follow up this week for placental question    Barnett Applebaum, MD, [facility], [facility] 04/14/2021  5:00 PM

## 2021-04-15 ENCOUNTER — Ambulatory Visit
Admission: RE | Admit: 2021-04-15 | Discharge: 2021-04-15 | Disposition: A | Discharge status: Home / Self Care | Payer: BC Managed Care – PPO | Source: Ambulatory Visit | Attending: Obstetrics & Gynecology | Admitting: Obstetrics & Gynecology

## 2021-04-15 DIAGNOSIS — O4592 Premature separation of placenta, unspecified, second trimester: Secondary | ICD-10-CM | POA: Diagnosis not present

## 2021-04-15 NOTE — Telephone Encounter (Signed)
Contacted [place] at [facility] and adv of STAT US needed for pt. They changed order to US OB LTD and RPH will need to sign. I will let him know to do so in clinic.   Called patient and told her to head to [facility] now as they are working her in.

## 2021-04-15 NOTE — Telephone Encounter (Signed)
Contacted patient via phone. Patient is aware of scheduled ultrasound.

## 2021-04-23 ENCOUNTER — Other Ambulatory Visit: Payer: Self-pay

## 2021-04-23 DIAGNOSIS — O0990 Supervision of high risk pregnancy, unspecified, unspecified trimester: Secondary | ICD-10-CM

## 2021-04-28 ENCOUNTER — Other Ambulatory Visit: Payer: Self-pay

## 2021-04-28 ENCOUNTER — Ambulatory Visit: Payer: BC Managed Care – PPO | Admitting: Dermatology

## 2021-04-28 ENCOUNTER — Ambulatory Visit: Payer: BC Managed Care – PPO | Attending: Obstetrics and Gynecology

## 2021-04-28 ENCOUNTER — Encounter: Payer: Self-pay | Admitting: Dermatology

## 2021-04-28 DIAGNOSIS — D485 Neoplasm of uncertain behavior of skin: Secondary | ICD-10-CM

## 2021-04-28 DIAGNOSIS — Z85828 Personal history of other malignant neoplasm of skin: Secondary | ICD-10-CM | POA: Diagnosis not present

## 2021-04-28 DIAGNOSIS — C4491 Basal cell carcinoma of skin, unspecified: Secondary | ICD-10-CM

## 2021-04-28 DIAGNOSIS — O09812 Supervision of pregnancy resulting from assisted reproductive technology, second trimester: Secondary | ICD-10-CM | POA: Diagnosis not present

## 2021-04-28 DIAGNOSIS — C44319 Basal cell carcinoma of skin of other parts of face: Secondary | ICD-10-CM | POA: Diagnosis not present

## 2021-04-28 DIAGNOSIS — Z363 Encounter for antenatal screening for malformations: Secondary | ICD-10-CM | POA: Insufficient documentation

## 2021-04-28 DIAGNOSIS — Z3689 Encounter for other specified antenatal screening: Secondary | ICD-10-CM | POA: Diagnosis not present

## 2021-04-28 DIAGNOSIS — L578 Other skin changes due to chronic exposure to nonionizing radiation: Secondary | ICD-10-CM

## 2021-04-28 DIAGNOSIS — C44310 Basal cell carcinoma of skin of unspecified parts of face: Secondary | ICD-10-CM

## 2021-04-28 DIAGNOSIS — O09521 Supervision of elderly multigravida, first trimester: Secondary | ICD-10-CM

## 2021-04-28 DIAGNOSIS — Z3A18 18 weeks gestation of pregnancy: Secondary | ICD-10-CM | POA: Insufficient documentation

## 2021-04-28 DIAGNOSIS — O09522 Supervision of elderly multigravida, second trimester: Secondary | ICD-10-CM | POA: Diagnosis present

## 2021-04-28 HISTORY — DX: Basal cell carcinoma of skin, unspecified: C44.91

## 2021-04-28 NOTE — Progress Notes (Addendum)
   NEW Patient Visit   Subjective  Angela [name] is a 45 y.o. female who presents for the following: Other (Spot of right temple that she noticed over the summer.She has a history of BCC of right temple in 2017. She is [redacted] weeks pregnant.).  The following portions of the chart were reviewed this encounter and updated as appropriate:   Tobacco  Allergies  Meds  Problems  Med Hx  Surg Hx  Fam Hx     Review of Systems:  No other skin or systemic complaints except as noted in HPI or Assessment and Plan.  Objective  Well appearing patient in no apparent distress; mood and affect are within normal limits.  A focused examination was performed including face. Relevant physical exam findings are noted in the Assessment and Plan.  Right Temple 1.1 x 0.7 cm pink papule   Assessment & Plan  Neoplasm of uncertain behavior of skin Right Temple  Epidermal / dermal shaving  Lesion diameter (cm):  1.1 Informed consent: discussed and consent obtained   Timeout: patient name, date of birth, surgical site, and procedure verified   Procedure prep:  Patient was prepped and draped in usual sterile fashion Prep type:  Isopropyl alcohol Anesthesia: the lesion was anesthetized in a standard fashion   Anesthetic:  1% lidocaine w/ epinephrine 1-100,000 buffered w/ 8.4% NaHCO3 Instrument used: flexible razor blade   Hemostasis achieved with: pressure, aluminum chloride and electrodesiccation   Outcome: patient tolerated procedure well   Post-procedure details: sterile dressing applied and wound care instructions given   Dressing type: bandage and petrolatum    Destruction of lesion Complexity: extensive   Destruction method: electrodesiccation and curettage   Informed consent: discussed and consent obtained   Timeout:  patient name, date of birth, surgical site, and procedure verified Procedure prep:  Patient was prepped and draped in usual sterile fashion Prep type:  Isopropyl  alcohol Anesthesia: the lesion was anesthetized in a standard fashion   Anesthetic:  1% lidocaine w/ epinephrine 1-100,000 buffered w/ 8.4% NaHCO3 Curettage performed in three different directions: Yes   Electrodesiccation performed over the curetted area: Yes   Lesion length (cm):  1.1 Lesion width (cm):  0.7 Margin per side (cm):  0.2 Final wound size (cm):  1.5 Hemostasis achieved with:  pressure and aluminum chloride Outcome: patient tolerated procedure well with no complications   Post-procedure details: sterile dressing applied and wound care instructions given   Dressing type: bandage and petrolatum    Specimen 1 - Surgical pathology Differential Diagnosis: Recurrent BCC vs other  Check Margins: No EDC today  BCC (basal cell carcinoma), face  Actinic Damage - chronic, secondary to cumulative UV radiation exposure/sun exposure over time - diffuse scaly erythematous macules with underlying dyspigmentation - Recommend daily broad spectrum sunscreen SPF 30+ to sun-exposed areas, reapply every 2 hours as needed.  - Recommend staying in the shade or wearing long sleeves, sun glasses (UVA+UVB protection) and wide brim hats (4-inch brim around the entire circumference of the hat). - Call for new or changing lesions.  Return in about 6 months (around 10/27/2021).  I, Ashok Cordia, CMA, am acting as scribe for Sarina Ser, MD . Documentation: I have reviewed the above documentation for accuracy and completeness, and I agree with the above.  Sarina Ser, MD

## 2021-04-28 NOTE — Patient Instructions (Signed)
Wound Care Instructions  Cleanse wound gently with soap and water once a day then pat dry with clean gauze. Apply a thing coat of Petrolatum (petroleum jelly, "Vaseline") over the wound (unless you have an allergy to this). We recommend that you use a new, sterile tube of Vaseline. Do not pick or remove scabs. Do not remove the yellow or white "healing tissue" from the base of the wound.  Cover the wound with fresh, clean, nonstick gauze and secure with paper tape. You may use Band-Aids in place of gauze and tape if the would is small enough, but would recommend trimming much of the tape off as there is often too much. Sometimes Band-Aids can irritate the skin.  You should call the office for your biopsy report after 1 week if you have not already been contacted.  If you experience any problems, such as abnormal amounts of bleeding, swelling, significant bruising, significant pain, or evidence of infection, please call the office immediately.  FOR ADULT SURGERY PATIENTS: If you need something for pain relief you may take 1 extra strength Tylenol (acetaminophen) AND 2 Ibuprofen (200mg each) together every 4 hours as needed for pain. (do not take these if you are allergic to them or if you have a reason you should not take them.) Typically, you may only need pain medication for 1 to 3 days.   If you have any questions or concerns for your doctor, please call our main line at [phone] and press option 4 to reach your doctor's medical assistant. If no one answers, please leave a voicemail as directed and we will return your call as soon as possible. Messages left after 4 pm will be answered the following business day.   You may also send us a message via MyChart. We typically respond to MyChart messages within 1-2 business days.  For prescription refills, please ask your pharmacy to contact our office. Our fax number is [phone].  If you have an urgent issue when the clinic is closed that  cannot wait until the next business day, you can page your doctor at the number below.    Please note that while we do our best to be available for urgent issues outside of office hours, we are not available 24/7.   If you have an urgent issue and are unable to reach us, you may choose to seek medical care at your doctor's office, retail clinic, urgent care center, or emergency room.  If you have a medical emergency, please immediately call 911 or go to the emergency department.  Pager Numbers  - Dr. Kowalski: [phone]  - Dr. Moye: [phone]  - Dr. Stewart: [phone]  In the event of inclement weather, please call our main line at [phone] for an update on the status of any delays or closures.  Dermatology Medication Tips: Please keep the boxes that topical medications come in in order to help keep track of the instructions about where and how to use these. Pharmacies typically print the medication instructions only on the boxes and not directly on the medication tubes.   If your medication is too expensive, please contact our office at [phone] option 4 or send us a message through MyChart.   We are unable to tell what your co-pay for medications will be in advance as this is different depending on your insurance coverage. However, we may be able to find a substitute medication at lower cost or fill out paperwork to get insurance to cover a needed   medication.   If a prior authorization is required to get your medication covered by your insurance company, please allow us 1-2 business days to complete this process.  Drug prices often vary depending on where the prescription is filled and some pharmacies may offer cheaper prices.  The website www.goodrx.com contains coupons for medications through different pharmacies. The prices here do not account for what the cost may be with help from insurance (it may be cheaper with your insurance), but the website can give you the  price if you did not use any insurance.  - You can print the associated coupon and take it with your prescription to the pharmacy.  - You may also stop by our office during regular business hours and pick up a GoodRx coupon card.  - If you need your prescription sent electronically to a different pharmacy, notify our office through [facility] MyChart or by phone at [phone] option 4.   

## 2021-05-04 ENCOUNTER — Other Ambulatory Visit: Payer: Self-pay

## 2021-05-04 ENCOUNTER — Encounter: Payer: Self-pay | Admitting: Obstetrics & Gynecology

## 2021-05-04 ENCOUNTER — Ambulatory Visit: Payer: BC Managed Care – PPO | Admitting: Dermatology

## 2021-05-04 ENCOUNTER — Ambulatory Visit (INDEPENDENT_AMBULATORY_CARE_PROVIDER_SITE_OTHER): Payer: BC Managed Care – PPO | Admitting: Obstetrics & Gynecology

## 2021-05-04 VITALS — BP 120/80 | Wt 173.0 lb

## 2021-05-04 DIAGNOSIS — O0992 Supervision of high risk pregnancy, unspecified, second trimester: Secondary | ICD-10-CM

## 2021-05-04 DIAGNOSIS — Z131 Encounter for screening for diabetes mellitus: Secondary | ICD-10-CM

## 2021-05-04 DIAGNOSIS — O09522 Supervision of elderly multigravida, second trimester: Secondary | ICD-10-CM

## 2021-05-04 LAB — POCT URINALYSIS DIPSTICK OB
Glucose, UA: NEGATIVE
POC,PROTEIN,UA: NEGATIVE

## 2021-05-04 NOTE — Addendum Note (Signed)
Addended by: Quintella Baton D on: 05/04/2021 08:46 AM   Modules accepted: Orders

## 2021-05-04 NOTE — Patient Instructions (Signed)
Questions to [facility] Provider During Pregnancy During pregnancy, you will go through a lot of changes and may have new experiences and emotions. It is natural to have many questions. Asking questions can help you prepare for what happens during pregnancy, during labor and delivery, and after delivery (postpartum). Your pregnancy health care provider is a good resource for reliable answers. Schedule an appointment with your health care provider if you are planning to get pregnant (preconception visit) or as soon as you know you are pregnant (prenatal visit). New questions will come up as your pregnancy progresses. Write them down before each prenatal visit. This will help ensure that you get the information you need. Questions to ask about pregnancy After you find out that you are pregnant, you will want to learn more about what to expect from your prenatal visits and the different screening tests you may need. You also need to understand how to best take care of yourself to help ensure a healthy pregnancy. You will want to know if you are at risk for complications and what problems you should watch for. Here are some common questions to ask your health care provider: Prenatal visits and tests How often will I come in for my prenatal visits? Should I have a prenatal dental checkup? What type of screening tests should I consider? What are the risks and benefits of these tests? What type of routine exams are suggested and when? When would you recommend an ultrasound, and what will it show? Is there a nurse line I can call if I have questions? Caring for yourself during pregnancy What is considered a healthy weight for me? Do I need to take any supplements? What kind of exercise should I get and how much? How much sleep is recommended now that I am pregnant? What kinds of things can I do to reduce stress and anxiety? Are there any travel restrictions? Is it okay to have sex? Eating and  drinking during pregnancy When should I start taking my prenatal vitamins? What vitamins do you recommend? What kinds of foods should I eat? Are there any foods I should avoid? What if I am on a special diet? Substance use and medicine use during pregnancy What types of substances should I avoid being exposed to? What if I am exposed at work? Why is it important not to smoke or drink alcohol? What about other drugs? Should I limit how much caffeine I get? Where can I get help if I am struggling to quit smoking, drinking, or taking drugs? Which medicines could be dangerous to take during pregnancy? Which supplements or herbal medicines could be dangerous to take? Are there vaccinations I should definitely get or avoid? Complications Do I have any medical conditions that will need special treatment during pregnancy? What can I do if I have morning sickness? What complications should I look out for? What are the symptoms? When should I call? What if I have bleeding? How much bleeding is normal? What symptoms indicate a possible emergency? What should I do if I have any of these symptoms? Questions to ask about labor and delivery As you move through your pregnancy, you will start to think more about labor and delivery. To help make sure you understand your options, ask your health care provider questions, such as: What are my options for labor and delivery? Should I create a birth plan? Can you give me some guidance? When is a good time to start working on this  plan? Where can I have my baby? Is a birth center or home birth an option for me? What is natural childbirth? How do I learn more about it and prepare for it? What are my options for pain management? What are the risks and benefits of drugs to manage pain? What methods, other than medicine, might be used to help manage pain? What do you do to promote vaginal delivery and avoid a C-section (cesarean delivery)? When would a cesarean  delivery be advised? Can I have a vaginal birth if I had a cesarean delivery in the past? How can I prepare for this? What is an episiotomy, and when might I need one? When would you recommend induced labor? Questions to ask about what happens after delivery While you are still pregnant, you can begin to think about questions that you may have after your baby is born. Here are a few questions that you may want to ask: How long will I need to stay in the hospital if I have a vaginal birth? What about if I have a cesarean delivery? Will I be in the same room as my baby (also called rooming-in)? What are the benefits of breastfeeding? When should I start preparing for breastfeeding? Can I take a class to prepare? How do I get back in shape after pregnancy? When can I exercise and have sex again? How soon can I get pregnant after giving birth? Should I use birth control? What are the symptoms of postpartum depression? What should I do if I have symptoms? Summary Getting reliable answers to your questions during pregnancy is important to help you feel reassured and prepared. Your health care provider can be a valuable resource. Ask how you can prepare for and have a healthy pregnancy. Ask about what to expect during pregnancy, labor, delivery, and after your baby is born (postpartum). Be sure you know what signs could indicate a possible problem and what to do if you suspect a problem. This information is not intended to replace advice given to you by your health care provider. Make sure you discuss any questions you have with your health care provider. Document Revised: 12/01/2019 Document Reviewed: 12/01/2019 Elsevier Patient Education  [place].

## 2021-05-04 NOTE — Progress Notes (Addendum)
  Subjective  Fetal Movement? yes Contractions? no Leaking Fluid? no Vaginal Bleeding? no  Objective  BP 120/80   Wt 173 lb (78.5 kg)   LMP 12/19/2020   BMI 30.65 kg/m  General: NAD Pumonary: no increased work of breathing Abdomen: gravid, non-tender Extremities: no edema Psychiatric: mood appropriate, affect full  Assessment  45 y.o. [id number] at [redacted]w[redacted]d by  09/25/2021, by Last Menstrual Period presenting for routine prenatal visit  Plan   Problem List Items Addressed This Visit       Other   Multigravida of advanced maternal age in second trimester   Other Visit Diagnoses     Supervision of high risk pregnancy in second trimester    -  Primary   Screening for diabetes mellitus       Relevant Orders   28 Week RH+Panel     Discussed MFM [place] results PNV, ASA Glucola 2 mos [place] for growth at 78, 32, 36 weeks APT 36 weeks CS 39 weeks  The following were addressed during this visit:  Breastfeeding Education - Early initiation of breastfeeding  - The importance of exclusive breastfeeding  - Risks of giving your baby anything other than breast milk if you are breastfeeding  - Nonpharmacological pain relief methods for labor  - The importance of early skin-to-skin contact  - Rooming-in on a 24-hour basis  - Feeding on demand or baby-led feeding  - Frequent feeding to help assure optimal milk production  - Effective positioning and attachment  - Exclusive breastfeeding for the first 6 months  - Individualized Education   18-21 weeks - Ultrasound   pregnancy6 Problems (from 03/02/21 to present)     Problem Noted Resolved   HRP (high risk pregnancy), second trimester 04/06/2021 by Gae Dry, MD No   Overview Addendum 04/06/2021 10:56 AM by Gae Dry, MD     Nursing Staff Provider  Office Location  [facility] Dating  [place], also IVF  Language  English Anatomy US  MFM  Flu Vaccine   Genetic Screen  NIPS: nml XY  TDaP vaccine    Hgb A1C or  GTT Third  trimester :   Covid vacc done   LAB RESULTS   Rhogam  n/a Blood Type O/Positive/-- (08/08 1443)   Feeding Plan  Antibody Negative (08/08 1443)  Contraception  Rubella 10.80 (08/08 1443)  Circumcision  RPR Reactive (08/08 1443)   Pediatrician   HBsAg Negative (08/08 1443)   Support Person Zaylah Blecha HIV Non Reactive (08/08 1443)  Prenatal Classes  Varicella Imm    GBS  (For PCN allergy, check sensitivities)   BTL Consent Donor egg preg/ infertility; 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

## 2021-05-05 ENCOUNTER — Telehealth: Payer: Self-pay

## 2021-05-05 NOTE — Telephone Encounter (Signed)
Patient informed of pathology results 

## 2021-05-05 NOTE — Telephone Encounter (Signed)
-----   Message from Ralene Bathe, MD sent at 05/04/2021  5:47 PM EDT ----- Diagnosis Skin , right temple BASAL CELL CARCINOMA, NODULAR PATTERN  Cancer - BCC Already treated Recheck next visit Low risk of recurrence, but if recurs, needs further treatment

## 2021-05-25 NOTE — Addendum Note (Signed)
Addended by: Ralene Bathe on: 05/25/2021 12:23 PM   Modules accepted: Level of Service

## 2021-06-02 ENCOUNTER — Ambulatory Visit (INDEPENDENT_AMBULATORY_CARE_PROVIDER_SITE_OTHER): Payer: BC Managed Care – PPO | Admitting: Obstetrics & Gynecology

## 2021-06-02 ENCOUNTER — Other Ambulatory Visit: Payer: Self-pay

## 2021-06-02 ENCOUNTER — Encounter: Payer: Self-pay | Admitting: Obstetrics & Gynecology

## 2021-06-02 VITALS — BP 120/80 | Wt 182.0 lb

## 2021-06-02 DIAGNOSIS — O0992 Supervision of high risk pregnancy, unspecified, second trimester: Secondary | ICD-10-CM

## 2021-06-02 DIAGNOSIS — O09522 Supervision of elderly multigravida, second trimester: Secondary | ICD-10-CM

## 2021-06-02 DIAGNOSIS — Z3A23 23 weeks gestation of pregnancy: Secondary | ICD-10-CM

## 2021-06-02 LAB — POCT URINALYSIS DIPSTICK OB
Glucose, UA: NEGATIVE
POC,PROTEIN,UA: NEGATIVE

## 2021-06-02 NOTE — Patient Instructions (Signed)
Recommendations to boost your immunity to prevent illness such as viral flu and colds, including covid19, are as follows:       - - -  Vitamin K2 and Vitamin D3  - - - Take Vitamin K2 at 200-300 mcg daily (usually 2-3 pills daily of the over the counter formulation). Take Vitamin D3 at 3000-4000 U daily (usually 3-4 pills daily of the over the counter formulation). Studies show that these two at high normal levels in your system are very effective in keeping your immunity so strong and protective that you will be unlikely to contract viral illness such as those listed above.  Dr Kenton Kingfisher  Oral Glucose Tolerance Test During Pregnancy Why am I having this test? The oral glucose tolerance test (OGTT) is done to check how your body processes blood sugar (glucose). This is one of several tests used to diagnose diabetes that develops during pregnancy (gestational diabetes mellitus). Gestational diabetes is a short-term form of diabetes that some women develop while they are pregnant. It usually occurs during the second trimester of pregnancy and goes away after delivery. Testing, or screening, for gestational diabetes usually occurs at weeks 24-28 of pregnancy. You may have the OGTT test after having a 1-hour glucose screening test if the results from that test indicate that you may have gestational diabetes. This test may also be needed if: You have a history of gestational diabetes. There is a history of giving birth to very large babies or of losing pregnancies (having stillbirths). You have signs and symptoms of diabetes, such as: Changes in your eyesight. Tingling or numbness in your hands or feet. Changes in hunger, thirst, and urination, and these are not explained by your pregnancy. What is being tested? This test measures the amount of glucose in your blood at different times during a period of 3 hours. This shows how well your body can process glucose. What kind of sample is taken? Blood  samples are required for this test. They are usually collected by inserting a needle into a blood vessel. How do I prepare for this test? For 3 days before your test, eat normally. Have plenty of carbohydrate-rich foods. Follow instructions from your health care provider about: Eating or drinking restrictions on the day of the test. You may be asked not to eat or drink anything other than water (to fast) starting 8-10 hours before the test. Changing or stopping your regular medicines. Some medicines may interfere with this test. Tell a health care provider about: All medicines you are taking, including vitamins, herbs, eye drops, creams, and over-the-counter medicines. Any blood disorders you have. Any surgeries you have had. Any medical conditions you have. What happens during the test? First, your blood glucose will be measured. This is referred to as your fasting blood glucose because you fasted before the test. Then, you will drink a glucose solution that contains a certain amount of glucose. Your blood glucose will be measured again 1, 2, and 3 hours after you drink the solution. This test takes about 3 hours to complete. You will need to stay at the testing location during this time. During the testing period: Do not eat or drink anything other than the glucose solution. Do not exercise. Do not use any products that contain nicotine or tobacco, such as cigarettes, e-cigarettes, and chewing tobacco. These can affect your test results. If you need help quitting, ask your health care provider. The testing procedure may vary among health care providers and hospitals. How  are the results reported? Your results will be reported as milligrams of glucose per deciliter of blood (mg/dL) or millimoles per liter (mmol/L). There is more than one source for screening and diagnosis reference values used to diagnose gestational diabetes. Your health care provider will compare your results to normal values  that were established after testing a large group of people (reference values). Reference values may vary among labs and hospitals. For this test (Carpenter-Coustan), reference values are: Fasting: 95 mg/dL (5.3 mmol/L). 1 hour: 180 mg/dL (10.0 mmol/L). 2 hour: 155 mg/dL (8.6 mmol/L). 3 hour: 140 mg/dL (7.8 mmol/L). What do the results mean? Results below the reference values are considered normal. If two or more of your blood glucose levels are at or above the reference values, you may be diagnosed with gestational diabetes. If only one level is high, your health care provider may suggest repeat testing or other tests to confirm a diagnosis. Talk with your health care provider about what your results mean. Questions to ask your health care provider Ask your health care provider, or the department that is doing the test: When will my results be ready? How will I get my results? What are my treatment options? What other tests do I need? What are my next steps? Summary The oral glucose tolerance test (OGTT) is one of several tests used to diagnose diabetes that develops during pregnancy (gestational diabetes mellitus). Gestational diabetes is a short-term form of diabetes that some women develop while they are pregnant. You may have the OGTT test after having a 1-hour glucose screening test if the results from that test show that you may have gestational diabetes. You may also have this test if you have any symptoms or risk factors for this type of diabetes. Talk with your health care provider about what your results mean. This information is not intended to replace advice given to you by your health care provider. Make sure you discuss any questions you have with your health care provider. Document Revised: 12/20/2019 Document Reviewed: 12/20/2019 Elsevier Patient Education  2022 [employer].

## 2021-06-02 NOTE — Addendum Note (Signed)
Addended by: Quintella Baton D on: 06/02/2021 08:45 AM   Modules accepted: Orders

## 2021-06-02 NOTE — Progress Notes (Signed)
  Subjective  Fetal Movement? yes Contractions? no Leaking Fluid? no Vaginal Bleeding? no  Objective  BP 120/80   Wt 182 lb (82.6 kg)   LMP 12/19/2020   BMI 32.24 kg/m  General: NAD Pumonary: no increased work of breathing Abdomen: gravid, non-tender Extremities: no edema Psychiatric: mood appropriate, affect full  Assessment  45 y.o. [id number] at [redacted]w[redacted]d by  09/25/2021, by Last Menstrual Period presenting for routine prenatal visit  Plan   Problem List Items Addressed This Visit      Other   Multigravida of advanced maternal age in second trimester  Other Visit Diagnoses    Supervision of high risk pregnancy in second trimester    -  Primary   [redacted] weeks gestation of pregnancy        PNV, [facility] Glucola nv [place] later this month (MFM) Plan APT 36 weeks, Plan CS  pregnancy6 Problems (from 03/02/21 to present)    Problem Noted Resolved   HRP (high risk pregnancy), second trimester 04/06/2021 by Gae Dry, MD No   Overview Addendum 05/04/2021  8:46 AM by Gae Dry, MD     Nursing Staff Provider  Office Location  [facility] Dating  [place], also IVF  Language  English Anatomy US  MFM, nml  Flu Vaccine   Genetic Screen  NIPS: nml XY  TDaP vaccine    Hgb A1C or  GTT Third trimester :   Covid vacc done   LAB RESULTS   Rhogam  n/a Blood Type O/Positive/-- (08/08 1443)   Feeding Plan Breast Antibody Negative (08/08 1443)  Contraception Infertility, none desired Rubella 10.80 (08/08 1443)  Circumcision  RPR Reactive (08/08 1443)   Pediatrician   HBsAg Negative (08/08 1443)   Support Person Kawanna Christley HIV Non Reactive (08/08 1443)  Prenatal Classes  Varicella Imm    GBS  (For PCN allergy, check sensitivities)   BTL Consent Donor egg preg/ infertility; not desired      Pap  03/02/21       Pelvis Tested Birth trauma, desires CS                       Multigravida of advanced maternal age in second trimester 04/06/2021 by Gae Dry, MD No       Barnett Applebaum, MD,  [facility], [facility] 06/02/2021  8:41 AM

## 2021-06-05 ENCOUNTER — Other Ambulatory Visit: Payer: Self-pay | Admitting: Obstetrics & Gynecology

## 2021-06-05 MED ORDER — CLOTRIMAZOLE-BETAMETHASONE 1-0.05 % EX CREA: 1.0000 "application " | TOPICAL_CREAM | Freq: Two times a day (BID) | CUTANEOUS | 0 refills | Status: DC

## 2021-06-16 ENCOUNTER — Other Ambulatory Visit: Payer: Self-pay

## 2021-06-16 DIAGNOSIS — O09522 Supervision of elderly multigravida, second trimester: Secondary | ICD-10-CM

## 2021-06-16 DIAGNOSIS — O0992 Supervision of high risk pregnancy, unspecified, second trimester: Secondary | ICD-10-CM

## 2021-06-23 ENCOUNTER — Other Ambulatory Visit: Payer: Self-pay

## 2021-06-23 ENCOUNTER — Encounter: Payer: Self-pay | Admitting: Obstetrics & Gynecology

## 2021-06-23 ENCOUNTER — Ambulatory Visit: Payer: BC Managed Care – PPO | Attending: Maternal & Fetal Medicine

## 2021-06-23 DIAGNOSIS — O09812 Supervision of pregnancy resulting from assisted reproductive technology, second trimester: Secondary | ICD-10-CM | POA: Insufficient documentation

## 2021-06-23 DIAGNOSIS — O0992 Supervision of high risk pregnancy, unspecified, second trimester: Secondary | ICD-10-CM

## 2021-06-23 DIAGNOSIS — Z362 Encounter for other antenatal screening follow-up: Secondary | ICD-10-CM | POA: Insufficient documentation

## 2021-06-23 DIAGNOSIS — O09522 Supervision of elderly multigravida, second trimester: Secondary | ICD-10-CM | POA: Insufficient documentation

## 2021-06-23 DIAGNOSIS — Z3A26 26 weeks gestation of pregnancy: Secondary | ICD-10-CM | POA: Diagnosis not present

## 2021-07-01 ENCOUNTER — Other Ambulatory Visit: Payer: BC Managed Care – PPO

## 2021-07-01 ENCOUNTER — Other Ambulatory Visit: Payer: Self-pay

## 2021-07-01 ENCOUNTER — Ambulatory Visit (INDEPENDENT_AMBULATORY_CARE_PROVIDER_SITE_OTHER): Payer: BC Managed Care – PPO | Admitting: Obstetrics

## 2021-07-01 VITALS — BP 122/70 | Wt 186.0 lb

## 2021-07-01 DIAGNOSIS — O0992 Supervision of high risk pregnancy, unspecified, second trimester: Secondary | ICD-10-CM

## 2021-07-01 DIAGNOSIS — Z131 Encounter for screening for diabetes mellitus: Secondary | ICD-10-CM

## 2021-07-01 DIAGNOSIS — O09522 Supervision of elderly multigravida, second trimester: Secondary | ICD-10-CM

## 2021-07-01 DIAGNOSIS — Z3A27 27 weeks gestation of pregnancy: Secondary | ICD-10-CM

## 2021-07-01 NOTE — Progress Notes (Signed)
  Routine Prenatal Care Visit  Subjective  Angela [name] is a 45 y.o. [phone] at [redacted]w[redacted]d being seen today for ongoing prenatal care.  She is currently monitored for the following issues for this high-risk pregnancy and has HRP (high risk pregnancy), second trimester and Multigravida of advanced maternal age in second trimester on their problem list.  ----------------------------------------------------------------------------------- Patient reports no complaints.    . Vag. Bleeding: None.  Movement: Present. Leaking Fluid denies.  ----------------------------------------------------------------------------------- The following portions of the patient's history were reviewed and updated as appropriate: allergies, current medications, past family history, past medical history, past social history, past surgical history and problem list. Problem list updated.  Objective  Blood pressure 122/70, weight 186 lb (84.4 kg), last menstrual period 12/19/2020, unknown if currently breastfeeding. Pregravid weight 145 lb (65.8 kg) Total Weight Gain 41 lb (18.6 kg) Urinalysis: Urine Protein    Urine Glucose    Fetal Status:     Movement: Present     General:  Alert, oriented and cooperative. Patient is in no acute distress.  Skin: Skin is warm and dry. No rash noted.   Cardiovascular: Normal heart rate noted  Respiratory: Normal respiratory effort, no problems with respiration noted  Abdomen: Soft, gravid, appropriate for gestational age. Pain/Pressure: Absent     Pelvic:  Cervical exam deferred        Extremities: Normal range of motion.     Mental Status: Normal mood and affect. Normal behavior. Normal judgment and thought content.   Assessment   45 y.o. [id number] at [redacted]w[redacted]d by  09/25/2021, by Last Menstrual Period presenting for routine prenatal visit  Plan   pregnancy6 Problems (from 03/02/21 to present)    Problem Noted Resolved   HRP (high risk pregnancy), second trimester 04/06/2021 by Gae Dry, MD No   Overview Addendum 05/04/2021  8:46 AM by Gae Dry, MD     Nursing Staff Provider  Office Location  [facility] Dating  [place], also IVF  Language  English Anatomy US  MFM, nml  Flu Vaccine   Genetic Screen  NIPS: nml XY  TDaP vaccine    Hgb A1C or  GTT Third trimester :   Covid vacc done   LAB RESULTS   Rhogam  n/a Blood Type O/Positive/-- (08/08 1443)   Feeding Plan Breast Antibody Negative (08/08 1443)  Contraception Infertility, none desired Rubella 10.80 (08/08 1443)  Circumcision  RPR Reactive (08/08 1443)   Pediatrician   HBsAg Negative (08/08 1443)   Support Person Markiya Keefe HIV Non Reactive (08/08 1443)  Prenatal Classes  Varicella Imm    GBS  (For PCN allergy, check sensitivities)   BTL Consent Donor egg preg/ infertility; not desired      Pap  03/02/21       Pelvis Tested Birth trauma, desires CS                       Multigravida of advanced maternal age in second trimester 04/06/2021 by Gae Dry, MD No       Preterm labor symptoms and general obstetric precautions including but not limited to vaginal bleeding, contractions, leaking of fluid and fetal movement were reviewed in detail with the patient. Please refer to After Visit Summary for other counseling recommendations.  Having her 28 week labs today.  No follow-ups on file.  [name], CNM  07/01/2021 9:10 AM

## 2021-07-01 NOTE — Progress Notes (Signed)
28 week labs today. No vb. No lof.  

## 2021-07-02 LAB — 28 WEEK RH+PANEL
Basophils Absolute: 0 10*3/uL (ref 0.0–0.2)
Basos: 0 %
EOS (ABSOLUTE): 0.1 10*3/uL (ref 0.0–0.4)
Eos: 1 %
Gestational Diabetes Screen: 110 mg/dL (ref 70–139)
HIV Screen 4th Generation wRfx: NONREACTIVE
Hematocrit: 30.8 % — ABNORMAL LOW (ref 34.0–46.6)
Hemoglobin: 10.4 g/dL — ABNORMAL LOW (ref 11.1–15.9)
Immature Grans (Abs): 0.1 10*3/uL (ref 0.0–0.1)
Immature Granulocytes: 1 %
Lymphocytes Absolute: 1.6 10*3/uL (ref 0.7–3.1)
Lymphs: 15 %
MCH: 29.9 pg (ref 26.6–33.0)
MCHC: 33.8 g/dL (ref 31.5–35.7)
MCV: 89 fL (ref 79–97)
Monocytes Absolute: 0.6 10*3/uL (ref 0.1–0.9)
Monocytes: 6 %
Neutrophils Absolute: 7.9 10*3/uL — ABNORMAL HIGH (ref 1.4–7.0)
Neutrophils: 77 %
Platelets: 325 10*3/uL (ref 150–450)
RBC: 3.48 x10E6/uL — ABNORMAL LOW (ref 3.77–5.28)
RDW: 12 % (ref 11.7–15.4)
RPR Ser Ql: NONREACTIVE
WBC: 10.3 10*3/uL (ref 3.4–10.8)

## 2021-07-14 ENCOUNTER — Encounter: Payer: Self-pay | Admitting: Obstetrics & Gynecology

## 2021-07-14 ENCOUNTER — Other Ambulatory Visit: Payer: Self-pay

## 2021-07-14 ENCOUNTER — Ambulatory Visit (INDEPENDENT_AMBULATORY_CARE_PROVIDER_SITE_OTHER): Payer: BC Managed Care – PPO | Admitting: Obstetrics & Gynecology

## 2021-07-14 VITALS — BP 120/70 | Wt 192.0 lb

## 2021-07-14 DIAGNOSIS — Z3A29 29 weeks gestation of pregnancy: Secondary | ICD-10-CM

## 2021-07-14 DIAGNOSIS — O0992 Supervision of high risk pregnancy, unspecified, second trimester: Secondary | ICD-10-CM

## 2021-07-14 DIAGNOSIS — O0993 Supervision of high risk pregnancy, unspecified, third trimester: Secondary | ICD-10-CM

## 2021-07-14 DIAGNOSIS — O09523 Supervision of elderly multigravida, third trimester: Secondary | ICD-10-CM

## 2021-07-14 LAB — POCT URINALYSIS DIPSTICK OB
Glucose, UA: NEGATIVE
POC,PROTEIN,UA: NEGATIVE

## 2021-07-14 NOTE — Addendum Note (Signed)
Addended by: Quintella Baton D on: 07/14/2021 09:20 AM   Modules accepted: Orders

## 2021-07-14 NOTE — Progress Notes (Signed)
°  Subjective  Fetal Movement? yes Contractions? no Leaking Fluid? no Vaginal Bleeding? no  Objective  BP 120/70    Wt 192 lb (87.1 kg)    LMP 12/19/2020    BMI 34.01 kg/m  General: NAD Pumonary: no increased work of breathing Abdomen: gravid, non-tender Extremities: no edema Psychiatric: mood appropriate, affect full  Assessment  45 y.o. [id number] at [redacted]w[redacted]d by  09/25/2021, by Last Menstrual Period presenting for routine prenatal visit  Plan   Problem List Items Addressed This Visit      Other   HRP (high risk pregnancy), second trimester   Multigravida of advanced maternal age in second trimester  Other Visit Diagnoses    Supervision of high risk pregnancy in third trimester    -  Primary   [redacted] weeks gestation of pregnancy        CS and BTL discussed, planned for 09/18/21 (39 weeks), scheduled L&D    Pros and cns of tubal discussed.  She will have IVF (frozen embryos currently) if desires another pregnancy in future, and so not in need of tubes nor risk for tubal if still present    CS for prior birth trauma discussed, pros and cons of labor and delivery vs primary CS PNV, [facility] [place] nexyt month (MFM) NST weekly starting at 36 weeks  pregnancy6 Problems (from 03/02/21 to present)    Problem Noted Resolved   HRP (high risk pregnancy), second trimester 04/06/2021 by Gae Dry, MD No   Overview Addendum 07/14/2021  9:00 AM by Gae Dry, MD     Nursing Staff Provider  Office Location  [facility] Dating  [place], also IVF  Language  English Anatomy US  MFM, nml  Flu Vaccine   Genetic Screen  NIPS: nml XY  TDaP vaccine    Hgb A1C or  GTT Third trimester : 110  Covid vacc done   LAB RESULTS   Rhogam  n/a Blood Type --/--/O POS Performed at [facility], [address] [place], [place] [address]  [phone] [id number])   Feeding Plan Breast Antibody Negative (08/08 1443)  Contraception Tubal planned Rubella 10.80 (08/08 1443)  Circumcision  RPR Non Reactive (12/07 0947)    Pediatrician   HBsAg Negative (08/08 1443)   Support Person Yeni Jiggetts HIV Non Reactive (12/07 0947)  Prenatal Classes  Varicella Imm    GBS  (For PCN allergy, check sensitivities)   BTL Consent Donor egg preg/ infertility; not needed      Pap  03/02/21       Pelvis Tested Birth trauma, desires CS                       Multigravida of advanced maternal age in second trimester 04/06/2021 by Gae Dry, MD No       Barnett Applebaum, MD, [facility], [facility] 07/14/2021  9:00 AM

## 2021-07-14 NOTE — Patient Instructions (Signed)

## 2021-07-17 ENCOUNTER — Telehealth: Payer: Self-pay | Admitting: Obstetrics & Gynecology

## 2021-07-17 NOTE — Telephone Encounter (Signed)
Patient is scheduled for H&P on 2/21 @ 9:15am w/ Dr. Kenton Kingfisher, Pre-admit Testing phone interview and COVID testing to be scheduled (patient will watch for notifications on MyChart), OR on Friday, 09/18/21, and postop on 3/6 @ 9:15am.

## 2021-07-17 NOTE — Telephone Encounter (Signed)
-----   Message from Gae Dry, MD sent at 07/14/2021  8:57 AM EST ----- Regarding: Surgery Surgery Booking Request Patient Full Name:  Angela [name]  MRN: [id number]  DOB: [birth year]  Surgeon: Hoyt Koch, MD  Requested Surgery Date and Time: 09/18/2021 Primary Diagnosis AND Code: Prior birth trauma, 43 weeks, sterility Secondary Diagnosis and Code:  Surgical Procedure: Cesarean section with tubal ligation RNFA Requested?: Yes L&D Notification: Yes Admission Status: surgery admit Length of Surgery: 50 min Special Case Needs: No H&P: Yes Phone Interview???:  Yes Interpreter: No Medical Clearance:  No Special Scheduling Instructions: No Any known health/anesthesia issues, diabetes, sleep apnea, latex allergy, defibrillator/pacemaker?: No Acuity: P1   (P1 highest, P2 delay may cause harm, P3 low, elective gyn, P4 lowest) Post op follow up visits: 1-2 weeks

## 2021-07-31 ENCOUNTER — Encounter: Payer: BC Managed Care – PPO | Admitting: Obstetrics & Gynecology

## 2021-08-03 ENCOUNTER — Other Ambulatory Visit: Payer: Self-pay

## 2021-08-03 DIAGNOSIS — O09522 Supervision of elderly multigravida, second trimester: Secondary | ICD-10-CM

## 2021-08-03 DIAGNOSIS — O09523 Supervision of elderly multigravida, third trimester: Secondary | ICD-10-CM

## 2021-08-03 DIAGNOSIS — O0990 Supervision of high risk pregnancy, unspecified, unspecified trimester: Secondary | ICD-10-CM

## 2021-08-04 ENCOUNTER — Ambulatory Visit (INDEPENDENT_AMBULATORY_CARE_PROVIDER_SITE_OTHER): Payer: BC Managed Care – PPO | Admitting: Obstetrics & Gynecology

## 2021-08-04 ENCOUNTER — Ambulatory Visit: Payer: BC Managed Care – PPO | Attending: Obstetrics

## 2021-08-04 ENCOUNTER — Encounter: Payer: Self-pay | Admitting: Obstetrics & Gynecology

## 2021-08-04 ENCOUNTER — Other Ambulatory Visit: Payer: Self-pay

## 2021-08-04 VITALS — BP 100/70 | Wt 197.0 lb

## 2021-08-04 VITALS — BP 108/61 | HR 97 | Temp 97.8°F | Ht 63.0 in | Wt 197.0 lb

## 2021-08-04 DIAGNOSIS — O09813 Supervision of pregnancy resulting from assisted reproductive technology, third trimester: Secondary | ICD-10-CM

## 2021-08-04 DIAGNOSIS — O0992 Supervision of high risk pregnancy, unspecified, second trimester: Secondary | ICD-10-CM

## 2021-08-04 DIAGNOSIS — O09522 Supervision of elderly multigravida, second trimester: Secondary | ICD-10-CM

## 2021-08-04 DIAGNOSIS — Z363 Encounter for antenatal screening for malformations: Secondary | ICD-10-CM | POA: Diagnosis not present

## 2021-08-04 DIAGNOSIS — Z3A32 32 weeks gestation of pregnancy: Secondary | ICD-10-CM

## 2021-08-04 DIAGNOSIS — O09523 Supervision of elderly multigravida, third trimester: Secondary | ICD-10-CM | POA: Diagnosis not present

## 2021-08-04 DIAGNOSIS — O0990 Supervision of high risk pregnancy, unspecified, unspecified trimester: Secondary | ICD-10-CM

## 2021-08-04 DIAGNOSIS — Z23 Encounter for immunization: Secondary | ICD-10-CM | POA: Diagnosis not present

## 2021-08-04 DIAGNOSIS — O0993 Supervision of high risk pregnancy, unspecified, third trimester: Secondary | ICD-10-CM

## 2021-08-04 NOTE — Addendum Note (Signed)
Addended by: Quintella Baton D on: 08/04/2021 01:53 PM   Modules accepted: Orders

## 2021-08-04 NOTE — Progress Notes (Signed)
°  Subjective  Fetal Movement? yes Contractions? no Leaking Fluid? no Vaginal Bleeding? no  Objective  BP 100/70    Wt 197 lb (89.4 kg)    LMP 12/19/2020    BMI 34.90 kg/m  General: NAD Pumonary: no increased work of breathing Abdomen: gravid, non-tender Extremities: no edema Psychiatric: mood appropriate, affect full  Assessment  46 y.o. [id number] at [redacted]w[redacted]d by  09/25/2021, by Last Menstrual Period presenting for routine prenatal visit  Plan   Problem List Items Addressed This Visit   None Visit Diagnoses    Supervision of high risk pregnancy in third trimester    -  Primary   Multigravida of advanced maternal age in third trimester        Plans CS, BTL at 39 weeks BPP 36 weeks, NST PNV, FMV, Iron  pregnancy6 Problems (from 03/02/21 to present)    Problem Noted Resolved   HRP (high risk pregnancy), second trimester 04/06/2021 by Gae Dry, MD No   Overview Addendum 07/14/2021  9:00 AM by Gae Dry, MD     Nursing Staff Provider  Office Location  [facility] Dating  [place], also IVF  Language  English Anatomy US  MFM, nml  Flu Vaccine   Genetic Screen  NIPS: nml XY  TDaP vaccine    Hgb A1C or  GTT Third trimester : 110  Covid vacc done   LAB RESULTS   Rhogam  n/a Blood Type --/--/O POS Performed at [facility], [address] [place], [place] [address]  [phone])   Feeding Plan Breast Antibody Negative (08/08 1443)  Contraception Tubal planned Rubella 10.80 (08/08 1443)  Circumcision  RPR Non Reactive (12/07 0947)   Pediatrician   HBsAg Negative (08/08 1443)   Support Person Alexiz Cothran HIV Non Reactive (12/07 0947)  Prenatal Classes  Varicella Imm    GBS  (For PCN allergy, check sensitivities)   BTL Consent Donor egg preg/ infertility; not needed      Pap  03/02/21       Pelvis Tested Birth trauma, desires CS                       Multigravida of advanced maternal age in second trimester 04/06/2021 by Gae Dry, MD No       Barnett Applebaum, MD, [facility], [facility] 08/04/2021  1:41 PM

## 2021-08-04 NOTE — Patient Instructions (Signed)
Surgery to Prevent Pregnancy Sterilization is surgery to prevent pregnancy. Sterilization is permanent. It should only be done if you are sure that you do not want to have children. For females, the fallopian tubes are either blocked or closed off. When the fallopian tubes are closed, the eggs that the ovaries release cannot enter the uterus, sperm cannot reach the eggs, and pregnancy is prevented. For males, the vas deferens is cut and then tied or burned (cauterized). The vas deferens is a tube that carries sperm from the testicles. This procedure prevents pregnancy by blocking sperm from going through the vas deferens and penis during ejaculation. Types of sterilization   For females, the surgeries include: Laparoscopic tubal ligation. In this surgery, the fallopian tubes are tied off, sealed with heat, or blocked with a clip, ring, or clamp. A small portion of each fallopian tube may also be removed. This surgery is done through several small cuts (incisions) with special instruments that are inserted into the abdomen. Postpartum tubal ligation. This is also called a mini-laparotomy. This surgery is done right after childbirth or 1 or 2 days after childbirth. In this surgery, the fallopian tubes are tied off, sealed with heat, or blocked with a clip, ring, or clamp. A small portion of each fallopian tube may also be removed. The surgery is done through a single incision in the abdomen. Tubal ligation during a C-section. In this surgery, the fallopian tubes are tied off, sealed with heat, or blocked with a clip, ring, or clamp. A small portion of each fallopian tube may also be removed. The surgery is done at the same time as a C-section delivery. For males, the surgeries include: Incision vasectomy. In this surgery, one or two small incisions are made in the scrotum. The vas deferens will be pulled out of the scrotum and cut. The vas deferens will be tied off or sealed with heat and placed back into  your scrotum. The incision will be closed with absorbable stitches (sutures). No scalpel vasectomy. In this surgery, a punctured opening is made in the scrotum. The vas deferens will be pulled out of the scrotum and cut. The vas deferens will be tied off or sealed with heat and placed back into your scrotum. The opening is small and will not require sutures. What are the benefits of sterilization? It is usually effective for a lifetime. The procedures are generally safe. For females, sterilization does not affect the hormones, like other types of birth control. Because of this, menstrual periods will not be affected. For both males and females, sexual desire and sexual performance will not be affected. What are the disadvantages of sterilization? Risks from the surgery Generally, sterilization is safe. Complications are rare. However, there are some risks. They include: Bleeding. Infection. Reaction to medicine used during the procedure. Injury to surrounding organs. Risks after sterilization After a successful surgery, you may have other problems. Female sterilization risks may include: Failure of the procedure. Sterilization is nearly 100% effective, but it can fail. In rare cases, the fallopian tubes can grow back together over time. If this happens, a female will be able to get pregnant again. A higher risk of having an ectopic pregnancy. An ectopic pregnancy is a pregnancy that grows outside of the uterus. This kind of pregnancy can lead to serious bleeding if it is not treated. Female sterilization risks may include: Bleeding and swelling of the scrotum. Failure of the procedure. There is a very small chance that the tied or cauterized  ends of the vas deferens may reconnect (recanalization). If this happens, a female could still make a female pregnant. Other risks may include: A risk that you may change your mind and decide you want have children. Sterilization may be reversed, but a reversal  is not always successful. Lack of protection against sexually transmitted infections (STIs). What happens during the procedure? The steps of the procedure depend on the type of sterilization you are having. The procedure may vary among health care providers and hospitals. Questions to ask your health care provider How effective are sterilization procedures? What type of procedure is right for me? Is it possible to reverse the procedure if I change my mind? What can I expect after the procedure? Where to find more information [employer]: JewelryExec.com.pt U.S. Department of Health and Human Services: DustingSprays.pl [facility]: www.urologyhealth.org Summary Sterilization is surgery to prevent pregnancy. There are different types of sterilization surgeries. Sterilization may be reversed, but a reversal is not always successful. Sterilization does not protect against STIs. This information is not intended to replace advice given to you by your health care provider. Make sure you discuss any questions you have with your health care provider. Document Revised: 04/12/2020 Document Reviewed: 04/12/2020 Elsevier Patient Education  2022 [employer].

## 2021-08-18 ENCOUNTER — Other Ambulatory Visit: Payer: Self-pay

## 2021-08-18 DIAGNOSIS — O09523 Supervision of elderly multigravida, third trimester: Secondary | ICD-10-CM

## 2021-08-20 ENCOUNTER — Encounter: Payer: Self-pay | Admitting: Obstetrics & Gynecology

## 2021-08-20 ENCOUNTER — Ambulatory Visit (INDEPENDENT_AMBULATORY_CARE_PROVIDER_SITE_OTHER): Payer: BC Managed Care – PPO | Admitting: Obstetrics & Gynecology

## 2021-08-20 ENCOUNTER — Other Ambulatory Visit: Payer: Self-pay

## 2021-08-20 VITALS — BP 100/60 | Wt 201.0 lb

## 2021-08-20 DIAGNOSIS — O0993 Supervision of high risk pregnancy, unspecified, third trimester: Secondary | ICD-10-CM

## 2021-08-20 DIAGNOSIS — Z3A34 34 weeks gestation of pregnancy: Secondary | ICD-10-CM

## 2021-08-20 DIAGNOSIS — O09523 Supervision of elderly multigravida, third trimester: Secondary | ICD-10-CM

## 2021-08-20 NOTE — Progress Notes (Signed)
°  Subjective  Fetal Movement? yes Contractions? no Leaking Fluid? no Vaginal Bleeding? no Some lower abd pressure at times Objective  BP 100/60    Wt 201 lb (91.2 kg)    LMP 12/19/2020    BMI 35.61 kg/m  General: NAD Pumonary: no increased work of breathing Abdomen: gravid, non-tender Extremities: no edema Psychiatric: mood appropriate, affect full  Assessment  46 y.o. [id number] at 100w6d by  09/25/2021, by Last Menstrual Period presenting for routine prenatal visit  Plan   Problem List Items Addressed This Visit   None Visit Diagnoses    Supervision of high risk pregnancy in third trimester    -  Primary   Multigravida of advanced maternal age in third trimester       104 weeks gestation of pregnancy        Plans CS, BTL at 39 weeks BPP 36 weeks, NST PNV, FMV, Iron  pregnancy6 Problems (from 03/02/21 to present)    Problem Noted Resolved   HRP (high risk pregnancy), second trimester 04/06/2021 by Gae Dry, MD No   Overview Addendum 07/14/2021  9:00 AM by Gae Dry, MD     Nursing Staff Provider  Office Location  [facility] Dating  [place], also IVF  Language  English Anatomy US  MFM, nml  Flu Vaccine   Genetic Screen  NIPS: nml XY  TDaP vaccine    Hgb A1C or  GTT Third trimester : 110  Covid vacc done   LAB RESULTS   Rhogam  n/a Blood Type --/--/O POS Performed at [facility], [address] [place], [place] [address]  [phone])   Feeding Plan Breast Antibody Negative (08/08 1443)  Contraception Tubal planned Rubella 10.80 (08/08 1443)  Circumcision  RPR Non Reactive (12/07 0947)   Pediatrician   HBsAg Negative (08/08 1443)   Support Person Marvie Brevik HIV Non Reactive (12/07 0947)  Prenatal Classes  Varicella Imm    GBS  (For PCN allergy, check sensitivities)   BTL Consent Donor egg preg/ infertility; 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

## 2021-08-25 ENCOUNTER — Ambulatory Visit: Payer: BC Managed Care – PPO | Attending: Obstetrics and Gynecology

## 2021-08-25 ENCOUNTER — Other Ambulatory Visit: Payer: Self-pay

## 2021-08-25 VITALS — BP 97/77 | HR 98 | Temp 98.2°F | Wt 200.2 lb

## 2021-08-25 DIAGNOSIS — O09293 Supervision of pregnancy with other poor reproductive or obstetric history, third trimester: Secondary | ICD-10-CM | POA: Insufficient documentation

## 2021-08-25 DIAGNOSIS — O09523 Supervision of elderly multigravida, third trimester: Secondary | ICD-10-CM | POA: Diagnosis present

## 2021-08-25 DIAGNOSIS — Z3A35 35 weeks gestation of pregnancy: Secondary | ICD-10-CM | POA: Insufficient documentation

## 2021-08-25 DIAGNOSIS — O09522 Supervision of elderly multigravida, second trimester: Secondary | ICD-10-CM

## 2021-08-25 DIAGNOSIS — O0992 Supervision of high risk pregnancy, unspecified, second trimester: Secondary | ICD-10-CM

## 2021-08-25 DIAGNOSIS — O09813 Supervision of pregnancy resulting from assisted reproductive technology, third trimester: Secondary | ICD-10-CM | POA: Diagnosis not present

## 2021-09-01 ENCOUNTER — Other Ambulatory Visit: Payer: BC Managed Care – PPO

## 2021-09-02 ENCOUNTER — Ambulatory Visit (INDEPENDENT_AMBULATORY_CARE_PROVIDER_SITE_OTHER): Payer: BC Managed Care – PPO | Admitting: Obstetrics & Gynecology

## 2021-09-02 ENCOUNTER — Other Ambulatory Visit: Payer: Self-pay

## 2021-09-02 VITALS — BP 128/82 | Wt 208.0 lb

## 2021-09-02 DIAGNOSIS — O09523 Supervision of elderly multigravida, third trimester: Secondary | ICD-10-CM

## 2021-09-02 DIAGNOSIS — Z3685 Encounter for antenatal screening for Streptococcus B: Secondary | ICD-10-CM

## 2021-09-02 DIAGNOSIS — Z3A36 36 weeks gestation of pregnancy: Secondary | ICD-10-CM

## 2021-09-02 DIAGNOSIS — O0993 Supervision of high risk pregnancy, unspecified, third trimester: Secondary | ICD-10-CM

## 2021-09-02 NOTE — Patient Instructions (Signed)

## 2021-09-02 NOTE — Progress Notes (Signed)
°  Subjective  Fetal Movement? yes Contractions? occas  Leaking Fluid? no Vaginal Bleeding? no  Objective  BP 128/82    Wt 208 lb (94.3 kg)    LMP 12/19/2020    BMI 36.85 kg/m  General: NAD Pumonary: no increased work of breathing Abdomen: gravid, non-tender Extremities: no edema Psychiatric: mood appropriate, affect full SVE 0/20/ballotable  A NST procedure was performed with FHR monitoring and a normal baseline established, appropriate time of 20-40 minutes of evaluation, and accels >2 seen w 15x15 characteristics.  Results show a REACTIVE NST.   Last BPP 8/8  Assessment  46 y.o. [id number] at [redacted]w[redacted]d by  09/25/2021, by Last Menstrual Period presenting for routine prenatal visit  Plan   Problem List Items Addressed This Visit   None Visit Diagnoses    Supervision of high risk pregnancy in third trimester    -  Primary   Multigravida of advanced maternal age in third trimester       [redacted] weeks gestation of pregnancy       Encounter for antenatal screening for Streptococcus B       Relevant Orders   Culture, beta strep (group b only)    PNV, [place], Labor precautions GBS CS BTL planned BPP 2/14, NST also that day  pregnancy6 Problems (from 03/02/21 to present)    Problem Noted Resolved   HRP (high risk pregnancy), second trimester 04/06/2021 by Gae Dry, MD No   Overview Addendum 07/14/2021  9:00 AM by Gae Dry, MD     Nursing Staff Provider  Office Location  [facility] Dating  [place], also IVF  Language  English Anatomy US  MFM, nml  Flu Vaccine   Genetic Screen  NIPS: nml XY  TDaP vaccine    Hgb A1C or  GTT Third trimester : 110  Covid vacc done   LAB RESULTS   Rhogam  n/a Blood Type --/--/O POS Performed at [facility], [address] [place], [place] [address]  [phone])   Feeding Plan Breast Antibody Negative (08/08 1443)  Contraception Tubal planned Rubella 10.80 (08/08 1443)  Circumcision  RPR Non Reactive (12/07 0947)   Pediatrician   HBsAg  Negative (08/08 1443)   Support Person Aaria Happ HIV Non Reactive (12/07 0947)  Prenatal Classes  Varicella Imm    GBS  (For PCN allergy, check sensitivities)   BTL Consent Donor egg preg/ infertility; not needed      Pap  03/02/21       Pelvis Tested Birth trauma, desires CS                       Multigravida of advanced maternal age in second trimester 04/06/2021 by Gae Dry, MD No       Barnett Applebaum, MD, [facility], [facility] 09/02/2021  10:44 AM

## 2021-09-02 NOTE — Addendum Note (Signed)
Addended by: Quintella Baton D on: 09/02/2021 11:13 AM   Modules accepted: Orders

## 2021-09-04 ENCOUNTER — Other Ambulatory Visit: Payer: Self-pay | Admitting: Obstetrics & Gynecology

## 2021-09-04 DIAGNOSIS — Z87898 Personal history of other specified conditions: Secondary | ICD-10-CM

## 2021-09-06 LAB — CULTURE, BETA STREP (GROUP B ONLY): Strep Gp B Culture: NEGATIVE

## 2021-09-08 ENCOUNTER — Other Ambulatory Visit: Payer: Self-pay

## 2021-09-08 ENCOUNTER — Ambulatory Visit (INDEPENDENT_AMBULATORY_CARE_PROVIDER_SITE_OTHER): Payer: BC Managed Care – PPO | Admitting: Obstetrics & Gynecology

## 2021-09-08 ENCOUNTER — Observation Stay
Admission: AD | Admit: 2021-09-08 | Discharge: 2021-09-08 | Disposition: A | Discharge status: Home / Self Care | Payer: BC Managed Care – PPO | Source: Ambulatory Visit | Attending: Obstetrics & Gynecology | Admitting: Obstetrics & Gynecology

## 2021-09-08 ENCOUNTER — Encounter: Payer: Self-pay | Admitting: Obstetrics & Gynecology

## 2021-09-08 ENCOUNTER — Ambulatory Visit (HOSPITAL_BASED_OUTPATIENT_CLINIC_OR_DEPARTMENT_OTHER): Payer: BC Managed Care – PPO

## 2021-09-08 VITALS — BP 120/80 | Wt 208.0 lb

## 2021-09-08 DIAGNOSIS — Z3A37 37 weeks gestation of pregnancy: Secondary | ICD-10-CM

## 2021-09-08 DIAGNOSIS — O09522 Supervision of elderly multigravida, second trimester: Secondary | ICD-10-CM | POA: Diagnosis not present

## 2021-09-08 DIAGNOSIS — O09523 Supervision of elderly multigravida, third trimester: Secondary | ICD-10-CM

## 2021-09-08 DIAGNOSIS — O09813 Supervision of pregnancy resulting from assisted reproductive technology, third trimester: Secondary | ICD-10-CM

## 2021-09-08 DIAGNOSIS — Z3490 Encounter for supervision of normal pregnancy, unspecified, unspecified trimester: Secondary | ICD-10-CM | POA: Diagnosis not present

## 2021-09-08 DIAGNOSIS — O0993 Supervision of high risk pregnancy, unspecified, third trimester: Secondary | ICD-10-CM

## 2021-09-08 DIAGNOSIS — O0992 Supervision of high risk pregnancy, unspecified, second trimester: Secondary | ICD-10-CM

## 2021-09-08 LAB — POCT URINALYSIS DIPSTICK OB
Glucose, UA: NEGATIVE
POC,PROTEIN,UA: NEGATIVE

## 2021-09-08 NOTE — Discharge Summary (Signed)
RN reviewed discharged instructions with patient. Gave patient opportunity for questions. All questions answered at this time. Pt verbalized understanding. Pt discharged home with her significant other.

## 2021-09-08 NOTE — Patient Instructions (Signed)

## 2021-09-08 NOTE — Progress Notes (Signed)
°  Subjective  Fetal Movement? yes Contractions? no Leaking Fluid? no Vaginal Bleeding? no  Objective  BP 120/80    Wt 208 lb (94.3 kg)    LMP 12/19/2020    BMI 36.85 kg/m  General: NAD Pumonary: no increased work of breathing Abdomen: gravid, non-tender Extremities: no edema Psychiatric: mood appropriate, affect full  A NST procedure was performed with FHR monitoring and a normal baseline established, appropriate time of 20-40 minutes of evaluation, and accels >2 seen w 15x15 characteristics.  Also may b=have some var decles, vs highly variable strip.   Results show a REACTIVE NST.   However, will proceed to L&D for further monitoring   Assessment  46 y.o. [id number] at [redacted]w[redacted]d by  09/25/2021, by Last Menstrual Period presenting for routine prenatal visit  Plan   Problem List Items Addressed This Visit      Other   Multigravida of advanced maternal age in second trimester  Other Visit Diagnoses    Supervision of high risk pregnancy in third trimester    -  Primary   Relevant Orders   POC Urinalysis Dipstick OB (Completed)   [redacted] weeks gestation of pregnancy          pregnancy6 Problems (from 03/02/21 to present)    Problem Noted Resolved   HRP (high risk pregnancy), second trimester 04/06/2021 by Gae Dry, MD No   Overview Addendum 07/14/2021  9:00 AM by Gae Dry, MD     Nursing Staff Provider  Office Location  [facility] Dating  [place], also IVF  Language  English Anatomy US  MFM, nml  Flu Vaccine   Genetic Screen  NIPS: nml XY  TDaP vaccine    Hgb A1C or  GTT Third trimester : 110  Covid vacc done   LAB RESULTS   Rhogam  n/a Blood Type --/--/O POS Performed at [facility], [address] [place],  [address]  [phone])   Feeding Plan Breast Antibody Negative (08/08 1443)  Contraception Tubal planned Rubella 10.80 (08/08 1443)  Circumcision  RPR Non Reactive (12/07 0947)   Pediatrician   HBsAg Negative (08/08 1443)   Support Person Kerston Landeck HIV Non Reactive (12/07 0947)  Prenatal Classes  Varicella Imm    GBS  (For PCN allergy, check sensitivities)   BTL Consent Donor egg preg/ infertility; not needed      Pap  03/02/21       Pelvis Tested Birth trauma, desires CS                       Multigravida of advanced maternal age in second trimester 04/06/2021 by Gae Dry, MD No  NST up and down, so will go to L&D for further monitoring  CS if non-reassuring   Barnett Applebaum, MD, [place], [facility] 09/08/2021  4:24 PM

## 2021-09-08 NOTE — OB Triage Note (Signed)
Pt presents from the office for NST. Pt denies bleeding or LOF. Pt reports positive fetal movement. VSS. Provider at bedside. Will continue to monitor.

## 2021-09-09 ENCOUNTER — Encounter: Payer: BC Managed Care – PPO | Admitting: Obstetrics & Gynecology

## 2021-09-09 ENCOUNTER — Encounter
Admission: RE | Admit: 2021-09-09 | Discharge: 2021-09-09 | Disposition: A | Discharge status: Home / Self Care | Payer: BC Managed Care – PPO | Source: Ambulatory Visit | Attending: Obstetrics & Gynecology | Admitting: Obstetrics & Gynecology

## 2021-09-09 HISTORY — DX: Other specified postprocedural states: Z98.890

## 2021-09-09 HISTORY — DX: Other specified postprocedural states: R11.2

## 2021-09-09 NOTE — Patient Instructions (Addendum)
Your procedure is scheduled on: Friday, February 24  Arrival Time: Please call Labor and Delivery the day before your scheduled C-Section to find out your arrival time. [phone].  Arrival: If your arrival time is prior to 6:00 am, please enter through the Emergency Room Entrance and you will be directed to Labor and Delivery. If your arrival time is 6:00 am or later, please enter the [facility] and follow the greeter's instructions.  Labor and Delivery  Laboring women (regardless of age) may have one designated support person and one additional visitor age 41+ at a time, and a doula registered with [facility] for the labor and delivery phase of their stay. (Doulas not registered with [facility] are considered visitors.) The visitor (not the designated support person) may rotate out with other people. Children under 16 are not allowed. The designated support person or the visitor may stay overnight in the room and may alternate nights if the birthing mother has a multi-night stay.  Mother Baby Unit, OB Specialty and Gynecological Care  A designated support person and up to two additional visitors ages 12+ are allowed at one time in the room. (In this case, children ages 12+ count toward the number of allowed visitors.) Children between the ages of 22 and 4 must be accompanied by an adult who is not the patient. The two visitors (not the designated support person) may rotate out with other people throughout the patients stay. The designated support person or a visitor age 63+ may stay overnight in the room and may alternate nights if the birthing mother/patient has a multi-night stay.   REMEMBER: Instructions that are not followed completely may result in serious medical risk, up to and including death; or upon the discretion of your surgeon and anesthesiologist your surgery may need to be rescheduled.  Do not eat food after midnight the night before surgery.  No gum chewing, lozengers  or hard candies.  You may however, drink CLEAR liquids up to 2 hours before you are scheduled to arrive for your surgery. Do not drink anything within 2 hours of your scheduled arrival time.  Clear liquids include: - water  - apple juice without pulp - gatorade (not RED, PURPLE, OR BLUE) - black coffee or tea (Do NOT add milk or creamers to the coffee or tea) Do NOT drink anything that is not on this list.  DO NOT TAKE ANY MEDICATIONS THE MORNING OF SURGERY   One week prior to surgery: starting February 17 Stop aspirin and Anti-inflammatories (NSAIDS) such as Advil, Aleve, Ibuprofen, Motrin, Naproxen, Naprosyn and Aspirin based products such as Excedrin, Goodys Powder, BC Powder. Stop ANY OVER THE COUNTER supplements until after surgery. You may however, continue to take Tylenol if needed for pain up until the day of surgery.  No Alcohol for 24 hours before or after surgery.  No Smoking including e-cigarettes for 24 hours prior to surgery.  No chewable tobacco products for at least 6 hours prior to surgery.  No nicotine patches on the day of surgery.  Do not use any "recreational" drugs for at least a week prior to your surgery.  Please be advised that the combination of cocaine and anesthesia may have negative outcomes, up to and including death. If you test positive for cocaine, your surgery will be cancelled.  On the morning of surgery brush your teeth with toothpaste and water, you may rinse your mouth with mouthwash if you wish. Do not swallow any toothpaste or mouthwash.  Use CHG wipes as directed on instruction sheet.  Do not wear jewelry, make-up, hairpins, clips or nail polish.  Do not wear lotions, powders, or perfumes.   Do not shave body from the neck down 48 hours prior to surgery just in case you cut yourself which could leave a site for infection.  Also, freshly shaved skin may become irritated if using the CHG soap.  Contact lenses, hearing aids and dentures  may not be worn into surgery.  Do not bring valuables to the hospital. [facility] is not responsible for any missing/lost belongings or valuables.   Notify your doctor if there is any change in your medical condition (cold, fever, infection).  Wear comfortable clothing (specific to your surgery type) to the hospital.  After surgery, you can help prevent lung complications by doing breathing exercises.  Take deep breaths and cough every 1-2 hours. Your doctor may order a device called an Incentive Spirometer to help you take deep breaths. When coughing or sneezing, hold a pillow firmly against your incision with both hands. This is called splinting. Doing this helps protect your incision. It also decreases belly discomfort.  Please call the [facility] Dept. at [phone] if you have any questions about these instructions.

## 2021-09-10 ENCOUNTER — Encounter: Payer: Self-pay | Admitting: Obstetrics & Gynecology

## 2021-09-10 NOTE — Telephone Encounter (Signed)
Let her know those ranges are indeed normal but I am fine w her coming over to hospital for an NST today to double check and reassure.  If normal, the visit wont take long.  And I am here and can see it.

## 2021-09-10 NOTE — Discharge Summary (Signed)
Patient presented for evaluation for NST and vital sign check, all of which are normal and reassuring.  I reviewed her vital signs and fetal tracing, both of which were reassuring.  Patient was discharge as she was not laboring and showed no signs of fetal concerns.  Barnett Applebaum, MD, [facility], [facility] 09/10/2021  7:49 AM

## 2021-09-14 ENCOUNTER — Other Ambulatory Visit: Payer: Self-pay | Admitting: Obstetrics & Gynecology

## 2021-09-14 DIAGNOSIS — Z1231 Encounter for screening mammogram for malignant neoplasm of breast: Secondary | ICD-10-CM

## 2021-09-15 ENCOUNTER — Other Ambulatory Visit: Payer: Self-pay

## 2021-09-15 ENCOUNTER — Ambulatory Visit (INDEPENDENT_AMBULATORY_CARE_PROVIDER_SITE_OTHER): Payer: BC Managed Care – PPO | Admitting: Obstetrics

## 2021-09-15 ENCOUNTER — Encounter: Payer: BC Managed Care – PPO | Admitting: Obstetrics & Gynecology

## 2021-09-15 VITALS — BP 122/70 | Wt 207.0 lb

## 2021-09-15 DIAGNOSIS — O0992 Supervision of high risk pregnancy, unspecified, second trimester: Secondary | ICD-10-CM

## 2021-09-15 DIAGNOSIS — Z3A38 38 weeks gestation of pregnancy: Secondary | ICD-10-CM

## 2021-09-15 DIAGNOSIS — O09522 Supervision of elderly multigravida, second trimester: Secondary | ICD-10-CM

## 2021-09-15 DIAGNOSIS — O0993 Supervision of high risk pregnancy, unspecified, third trimester: Secondary | ICD-10-CM

## 2021-09-15 LAB — POCT URINALYSIS DIPSTICK OB
Glucose, UA: NEGATIVE
POC,PROTEIN,UA: NEGATIVE

## 2021-09-15 NOTE — Progress Notes (Signed)
Routine Prenatal Care Visit   Subjective  Angela [name] is a 46 y.o. [phone] at [redacted]w[redacted]d being seen today for ongoing prenatal care.  She is currently monitored for the following issues for this high-risk pregnancy and has HRP (high risk pregnancy), second trimester; Multigravida of advanced maternal age in second trimester; and Pregnancy on their problem list.  ----------------------------------------She is scheduled for a primary CS in three days. Here for an NST and to discuss her CS. She does have some ankle edema. Ample fetal movement Contractions: Irritability. Vag. Bleeding: None.  Movement: Present. Leaking Fluid denies.  ----------------------------------------------------------------------------------- The following portions of the patient's history were reviewed and updated as appropriate: allergies, current medications, past family history, past medical history, past social history, past surgical history and problem list. Problem list updated.  Objective  Blood pressure 122/70, weight 207 lb (93.9 kg), last menstrual period 12/19/2020, unknown if currently breastfeeding. Pregravid weight 145 lb (65.8 kg) Total Weight Gain 62 lb (28.1 kg) Urinalysis: Urine Protein Negative  Urine Glucose Negative  Fetal Status:     Movement: Present     General:  Alert, oriented and cooperative. Patient is in no acute distress.  Skin: Skin is warm and dry. No rash noted.   Cardiovascular: Normal heart rate noted  Respiratory: Normal respiratory effort, no problems with respiration noted  Abdomen: Soft, gravid, appropriate for gestational age. Pain/Pressure: Present     Pelvic:  Cervical exam deferred        Extremities: Normal range of motion.     Mental Status: Normal mood and affect. Normal behavior. Normal judgment and thought content.   Assessment   46 y.o. [id number] at [redacted]w[redacted]d by  09/25/2021, by Last Menstrual Period presenting for routine prenatal visit  Plan   pregnancy6 Problems (from  03/02/21 to present)     Problem Noted Resolved   HRP (high risk pregnancy), second trimester 04/06/2021 by Gae Dry, MD No   Overview Addendum 07/14/2021  9:00 AM by Gae Dry, MD     Nursing Staff Provider  Office Location  [facility] Dating  [place], also IVF  Language  English Anatomy US  MFM, nml  Flu Vaccine   Genetic Screen  NIPS: nml XY  TDaP vaccine    Hgb A1C or  GTT Third trimester : 110  Covid vacc done   LAB RESULTS   Rhogam  n/a Blood Type --/--/O POS Performed at [facility], [address] [place], [place] [address]  [phone])   Feeding Plan Breast Antibody Negative (08/08 1443)  Contraception Tubal planned Rubella 10.80 (08/08 1443)  Circumcision  RPR Non Reactive (12/07 0947)   Pediatrician   HBsAg Negative (08/08 1443)   Support Person Karista Aispuro HIV Non Reactive (12/07 0947)  Prenatal Classes  Varicella Imm    GBS  (For PCN allergy, check sensitivities)   BTL Consent Donor egg preg/ infertility; not needed      Pap  03/02/21       Pelvis Tested Birth trauma, desires CS                       Multigravida of advanced maternal age in second trimester 04/06/2021 by Gae Dry, MD No        Term labor symptoms and general obstetric precautions including but not limited to vaginal bleeding, contractions, leaking of fluid and fetal movement were reviewed in detail with the patient. Please refer to After Visit Summary for other counseling  recommendations.   She will present to labor and Delivery in three days for her CS.  [name], CNM  09/15/2021 9:58 AM

## 2021-09-16 ENCOUNTER — Other Ambulatory Visit: Payer: BC Managed Care – PPO

## 2021-09-16 ENCOUNTER — Encounter: Payer: Self-pay | Admitting: Urgent Care

## 2021-09-16 ENCOUNTER — Encounter
Admission: RE | Admit: 2021-09-16 | Discharge: 2021-09-16 | Disposition: A | Discharge status: Home / Self Care | Payer: BC Managed Care – PPO | Source: Ambulatory Visit | Attending: Obstetrics & Gynecology | Admitting: Obstetrics & Gynecology

## 2021-09-16 DIAGNOSIS — Z87898 Personal history of other specified conditions: Secondary | ICD-10-CM | POA: Insufficient documentation

## 2021-09-16 DIAGNOSIS — Z20822 Contact with and (suspected) exposure to covid-19: Secondary | ICD-10-CM | POA: Insufficient documentation

## 2021-09-16 DIAGNOSIS — Z01812 Encounter for preprocedural laboratory examination: Secondary | ICD-10-CM | POA: Insufficient documentation

## 2021-09-16 LAB — CBC
HCT: 30.1 % — ABNORMAL LOW (ref 36.0–46.0)
Hemoglobin: 9.7 g/dL — ABNORMAL LOW (ref 12.0–15.0)
MCH: 27.6 pg (ref 26.0–34.0)
MCHC: 32.2 g/dL (ref 30.0–36.0)
MCV: 85.5 fL (ref 80.0–100.0)
Platelets: 305 10*3/uL (ref 150–400)
RBC: 3.52 MIL/uL — ABNORMAL LOW (ref 3.87–5.11)
RDW: 14.6 % (ref 11.5–15.5)
WBC: 7.8 10*3/uL (ref 4.0–10.5)
nRBC: 0 % (ref 0.0–0.2)

## 2021-09-16 LAB — TYPE AND SCREEN
ABO/RH(D): O POS
Antibody Screen: NEGATIVE
Extend sample reason: UNDETERMINED

## 2021-09-16 LAB — SARS CORONAVIRUS 2 (TAT 6-24 HRS): SARS Coronavirus 2: NEGATIVE

## 2021-09-18 ENCOUNTER — Encounter: Payer: Self-pay | Admitting: Obstetrics & Gynecology

## 2021-09-18 ENCOUNTER — Inpatient Hospital Stay: Payer: BC Managed Care – PPO | Admitting: Anesthesiology

## 2021-09-18 ENCOUNTER — Other Ambulatory Visit: Payer: Self-pay

## 2021-09-18 ENCOUNTER — Encounter
Admission: RE | Disposition: A | Discharge status: Home / Self Care | Payer: Self-pay | Source: Home / Self Care | Attending: Obstetrics & Gynecology

## 2021-09-18 ENCOUNTER — Inpatient Hospital Stay
Admission: RE | Admit: 2021-09-18 | Discharge: 2021-09-20 | DRG: 784 | Disposition: A | Discharge status: Home / Self Care | Payer: BC Managed Care – PPO | Attending: Obstetrics & Gynecology | Admitting: Obstetrics & Gynecology

## 2021-09-18 DIAGNOSIS — Z85828 Personal history of other malignant neoplasm of skin: Secondary | ICD-10-CM

## 2021-09-18 DIAGNOSIS — Z3A39 39 weeks gestation of pregnancy: Secondary | ICD-10-CM

## 2021-09-18 DIAGNOSIS — Z8768 Personal history of other (corrected) conditions arising in the perinatal period: Secondary | ICD-10-CM

## 2021-09-18 DIAGNOSIS — D62 Acute posthemorrhagic anemia: Secondary | ICD-10-CM | POA: Diagnosis not present

## 2021-09-18 DIAGNOSIS — Z87898 Personal history of other specified conditions: Secondary | ICD-10-CM

## 2021-09-18 DIAGNOSIS — Z302 Encounter for sterilization: Secondary | ICD-10-CM

## 2021-09-18 DIAGNOSIS — O09523 Supervision of elderly multigravida, third trimester: Secondary | ICD-10-CM | POA: Diagnosis not present

## 2021-09-18 DIAGNOSIS — O99214 Obesity complicating childbirth: Secondary | ICD-10-CM | POA: Diagnosis present

## 2021-09-18 DIAGNOSIS — O09522 Supervision of elderly multigravida, second trimester: Secondary | ICD-10-CM | POA: Diagnosis present

## 2021-09-18 DIAGNOSIS — O9081 Anemia of the puerperium: Secondary | ICD-10-CM | POA: Diagnosis not present

## 2021-09-18 DIAGNOSIS — O09293 Supervision of pregnancy with other poor reproductive or obstetric history, third trimester: Secondary | ICD-10-CM | POA: Diagnosis not present

## 2021-09-18 DIAGNOSIS — Z20822 Contact with and (suspected) exposure to covid-19: Secondary | ICD-10-CM | POA: Diagnosis present

## 2021-09-18 DIAGNOSIS — O26893 Other specified pregnancy related conditions, third trimester: Secondary | ICD-10-CM | POA: Diagnosis present

## 2021-09-18 DIAGNOSIS — O0993 Supervision of high risk pregnancy, unspecified, third trimester: Secondary | ICD-10-CM

## 2021-09-18 DIAGNOSIS — O09813 Supervision of pregnancy resulting from assisted reproductive technology, third trimester: Secondary | ICD-10-CM | POA: Diagnosis not present

## 2021-09-18 HISTORY — DX: Personal history of other (corrected) conditions arising in the perinatal period: Z87.68

## 2021-09-18 HISTORY — DX: Personal history of other specified conditions: Z87.898

## 2021-09-18 SURGERY — Surgical Case
Anesthesia: Spinal | Laterality: Bilateral

## 2021-09-18 MED ORDER — CHLORHEXIDINE GLUCONATE 0.12 % MT SOLN
15.0000 mL | Freq: Once | OROMUCOSAL | Status: AC
  Administered 2021-09-18: 15 mL via OROMUCOSAL
  Filled 2021-09-18: qty 15

## 2021-09-18 MED ORDER — NALOXONE HCL 0.4 MG/ML IJ SOLN: 0.4000 mg | INTRAMUSCULAR | Status: DC | PRN

## 2021-09-18 MED ORDER — IBUPROFEN 600 MG PO TABS: 600.0000 mg | ORAL_TABLET | Freq: Four times a day (QID) | ORAL | Status: DC

## 2021-09-18 MED ORDER — POVIDONE-IODINE 10 % EX SWAB
2.0000 "application " | Freq: Once | CUTANEOUS | Status: AC
  Administered 2021-09-18: 2 via TOPICAL

## 2021-09-18 MED ORDER — OXYTOCIN-SODIUM CHLORIDE 30-0.9 UT/500ML-% IV SOLN
INTRAVENOUS | Status: DC | PRN
  Administered 2021-09-18: 250 mL/h via INTRAVENOUS

## 2021-09-18 MED ORDER — DIPHENHYDRAMINE HCL 25 MG PO CAPS: 25.0000 mg | ORAL_CAPSULE | ORAL | Status: DC | PRN

## 2021-09-18 MED ORDER — SCOPOLAMINE 1 MG/3DAYS TD PT72
1.0000 | MEDICATED_PATCH | Freq: Once | TRANSDERMAL | Status: DC
  Administered 2021-09-18: 1.5 mg via TRANSDERMAL
  Filled 2021-09-18: qty 1

## 2021-09-18 MED ORDER — PHENYLEPHRINE HCL-NACL 20-0.9 MG/250ML-% IV SOLN
INTRAVENOUS | Status: DC | PRN
  Administered 2021-09-18: 50 ug/min via INTRAVENOUS

## 2021-09-18 MED ORDER — ACETAMINOPHEN 500 MG PO TABS
1000.0000 mg | ORAL_TABLET | Freq: Four times a day (QID) | ORAL | Status: AC
  Administered 2021-09-18 – 2021-09-19 (×4): 1000 mg via ORAL
  Filled 2021-09-18 (×4): qty 2

## 2021-09-18 MED ORDER — ORAL CARE MOUTH RINSE: 15.0000 mL | Freq: Once | OROMUCOSAL | Status: AC

## 2021-09-18 MED ORDER — OXYCODONE HCL 5 MG PO TABS
5.0000 mg | ORAL_TABLET | ORAL | Status: DC | PRN
  Administered 2021-09-19 (×3): 5 mg via ORAL
  Administered 2021-09-19 – 2021-09-20 (×4): 10 mg via ORAL
  Filled 2021-09-18 (×2): qty 2
  Filled 2021-09-18: qty 1
  Filled 2021-09-18: qty 2
  Filled 2021-09-18 (×2): qty 1
  Filled 2021-09-18: qty 2

## 2021-09-18 MED ORDER — SOD CITRATE-CITRIC ACID 500-334 MG/5ML PO SOLN
ORAL | Status: AC
  Administered 2021-09-18: 30 mL via ORAL
  Filled 2021-09-18: qty 15

## 2021-09-18 MED ORDER — BUPIVACAINE 0.25 % ON-Q PUMP DUAL CATH 400 ML
400.0000 mL | INJECTION | Status: DC
  Filled 2021-09-18: qty 400

## 2021-09-18 MED ORDER — SODIUM CHLORIDE 0.9% FLUSH: 3.0000 mL | INTRAVENOUS | Status: DC | PRN

## 2021-09-18 MED ORDER — GLYCOPYRROLATE 0.2 MG/ML IJ SOLN
INTRAMUSCULAR | Status: DC | PRN
  Administered 2021-09-18: .2 mg via INTRAVENOUS

## 2021-09-18 MED ORDER — KETOROLAC TROMETHAMINE 30 MG/ML IJ SOLN: 30.0000 mg | Freq: Four times a day (QID) | INTRAMUSCULAR | Status: AC | PRN

## 2021-09-18 MED ORDER — ONDANSETRON HCL 4 MG/2ML IJ SOLN
INTRAMUSCULAR | Status: AC
  Filled 2021-09-18: qty 2

## 2021-09-18 MED ORDER — OXYTOCIN-SODIUM CHLORIDE 30-0.9 UT/500ML-% IV SOLN
INTRAVENOUS | Status: AC
  Administered 2021-09-18: 2.5 [IU]/h via INTRAVENOUS
  Filled 2021-09-18: qty 1000

## 2021-09-18 MED ORDER — ZOLPIDEM TARTRATE 5 MG PO TABS: 5.0000 mg | ORAL_TABLET | Freq: Every evening | ORAL | Status: DC | PRN

## 2021-09-18 MED ORDER — LACTATED RINGERS IV SOLN: INTRAVENOUS | Status: DC

## 2021-09-18 MED ORDER — SIMETHICONE 80 MG PO CHEW
80.0000 mg | CHEWABLE_TABLET | Freq: Three times a day (TID) | ORAL | Status: DC
  Administered 2021-09-18 – 2021-09-20 (×6): 80 mg via ORAL
  Filled 2021-09-18 (×6): qty 1

## 2021-09-18 MED ORDER — WITCH HAZEL-GLYCERIN EX PADS: 1.0000 "application " | MEDICATED_PAD | CUTANEOUS | Status: DC | PRN

## 2021-09-18 MED ORDER — FENTANYL CITRATE (PF) 100 MCG/2ML IJ SOLN
INTRAMUSCULAR | Status: DC | PRN
Start: 2021-09-18 — End: 2021-09-18
  Administered 2021-09-18: 15 ug via INTRATHECAL

## 2021-09-18 MED ORDER — PHENYLEPHRINE HCL-NACL 20-0.9 MG/250ML-% IV SOLN
INTRAVENOUS | Status: AC
  Filled 2021-09-18: qty 250

## 2021-09-18 MED ORDER — EPHEDRINE 5 MG/ML INJ
INTRAVENOUS | Status: AC
  Filled 2021-09-18: qty 5

## 2021-09-18 MED ORDER — OXYTOCIN-SODIUM CHLORIDE 30-0.9 UT/500ML-% IV SOLN
2.5000 [IU]/h | INTRAVENOUS | Status: AC
  Filled 2021-09-18: qty 500

## 2021-09-18 MED ORDER — ACETAMINOPHEN 325 MG PO TABS
650.0000 mg | ORAL_TABLET | ORAL | Status: DC | PRN
  Administered 2021-09-19 – 2021-09-20 (×3): 650 mg via ORAL
  Filled 2021-09-18 (×3): qty 2

## 2021-09-18 MED ORDER — NALOXONE HCL 4 MG/10ML IJ SOLN
1.0000 ug/kg/h | INTRAVENOUS | Status: DC | PRN
  Filled 2021-09-18: qty 5

## 2021-09-18 MED ORDER — ONDANSETRON HCL 4 MG/2ML IJ SOLN: 4.0000 mg | Freq: Three times a day (TID) | INTRAMUSCULAR | Status: DC | PRN

## 2021-09-18 MED ORDER — LACTATED RINGERS IV SOLN
INTRAVENOUS | Status: DC
Start: 2021-09-18 — End: 2021-09-18

## 2021-09-18 MED ORDER — KETOROLAC TROMETHAMINE 30 MG/ML IJ SOLN
30.0000 mg | Freq: Four times a day (QID) | INTRAMUSCULAR | Status: DC
  Administered 2021-09-18 – 2021-09-19 (×3): 30 mg via INTRAVENOUS
  Filled 2021-09-18 (×3): qty 1

## 2021-09-18 MED ORDER — BUPIVACAINE HCL (PF) 0.5 % IJ SOLN
INTRAMUSCULAR | Status: DC | PRN
Start: 2021-09-18 — End: 2021-09-18
  Administered 2021-09-18: 10 mL

## 2021-09-18 MED ORDER — DIBUCAINE (PERIANAL) 1 % EX OINT: 1.0000 "application " | TOPICAL_OINTMENT | CUTANEOUS | Status: DC | PRN

## 2021-09-18 MED ORDER — MEPERIDINE HCL 25 MG/ML IJ SOLN: 6.2500 mg | INTRAMUSCULAR | Status: DC | PRN

## 2021-09-18 MED ORDER — BUPIVACAINE IN DEXTROSE 0.75-8.25 % IT SOLN
INTRATHECAL | Status: DC | PRN
  Administered 2021-09-18: 1.6 mL via INTRATHECAL

## 2021-09-18 MED ORDER — COCONUT OIL OIL
1.0000 "application " | TOPICAL_OIL | Status: DC | PRN
  Filled 2021-09-18: qty 120

## 2021-09-18 MED ORDER — MORPHINE SULFATE (PF) 0.5 MG/ML IJ SOLN
INTRAMUSCULAR | Status: DC | PRN
  Administered 2021-09-18: .1 mg via INTRATHECAL

## 2021-09-18 MED ORDER — SENNOSIDES-DOCUSATE SODIUM 8.6-50 MG PO TABS
2.0000 | ORAL_TABLET | ORAL | Status: DC
  Administered 2021-09-18 – 2021-09-20 (×3): 2 via ORAL
  Filled 2021-09-18 (×3): qty 2

## 2021-09-18 MED ORDER — DIPHENHYDRAMINE HCL 50 MG/ML IJ SOLN
12.5000 mg | INTRAMUSCULAR | Status: DC | PRN
  Administered 2021-09-18: 12.5 mg via INTRAVENOUS
  Filled 2021-09-18: qty 1

## 2021-09-18 MED ORDER — PRENATAL MULTIVITAMIN CH
1.0000 | ORAL_TABLET | Freq: Every day | ORAL | Status: DC
  Administered 2021-09-18 – 2021-09-20 (×3): 1 via ORAL
  Filled 2021-09-18 (×3): qty 1

## 2021-09-18 MED ORDER — SOD CITRATE-CITRIC ACID 500-334 MG/5ML PO SOLN: 30.0000 mL | ORAL | Status: AC

## 2021-09-18 MED ORDER — BUPIVACAINE HCL (PF) 0.5 % IJ SOLN
INTRAMUSCULAR | Status: AC
  Filled 2021-09-18: qty 30

## 2021-09-18 MED ORDER — BUPIVACAINE HCL 0.5 % IJ SOLN
10.0000 mL | Freq: Once | INTRAMUSCULAR | Status: DC
Start: 2021-09-18 — End: 2021-09-18
  Filled 2021-09-18: qty 10

## 2021-09-18 MED ORDER — EPHEDRINE SULFATE-NACL 50-0.9 MG/10ML-% IV SOSY
PREFILLED_SYRINGE | INTRAVENOUS | Status: DC | PRN
  Administered 2021-09-18 (×5): 5 mg via INTRAVENOUS

## 2021-09-18 MED ORDER — MORPHINE SULFATE (PF) 2 MG/ML IV SOLN: 1.0000 mg | INTRAVENOUS | Status: DC | PRN

## 2021-09-18 MED ORDER — DIPHENHYDRAMINE HCL 25 MG PO CAPS: 25.0000 mg | ORAL_CAPSULE | Freq: Four times a day (QID) | ORAL | Status: DC | PRN

## 2021-09-18 MED ORDER — SIMETHICONE 80 MG PO CHEW: 80.0000 mg | CHEWABLE_TABLET | ORAL | Status: DC | PRN

## 2021-09-18 MED ORDER — BUPIVACAINE ON-Q PAIN PUMP (FOR ORDER SET NO CHG)
INJECTION | Status: DC
  Filled 2021-09-18: qty 1

## 2021-09-18 MED ORDER — FENTANYL CITRATE (PF) 100 MCG/2ML IJ SOLN
INTRAMUSCULAR | Status: AC
  Filled 2021-09-18: qty 2

## 2021-09-18 MED ORDER — MENTHOL 3 MG MT LOZG
1.0000 | LOZENGE | OROMUCOSAL | Status: DC | PRN
  Filled 2021-09-18: qty 9

## 2021-09-18 MED ORDER — LIDOCAINE HCL (PF) 1 % IJ SOLN
INTRAMUSCULAR | Status: DC | PRN
  Administered 2021-09-18: 3 mL via SUBCUTANEOUS

## 2021-09-18 MED ORDER — ONDANSETRON HCL 4 MG/2ML IJ SOLN
INTRAMUSCULAR | Status: DC | PRN
  Administered 2021-09-18: 4 mg via INTRAVENOUS

## 2021-09-18 MED ORDER — MORPHINE SULFATE (PF) 0.5 MG/ML IJ SOLN
INTRAMUSCULAR | Status: AC
  Filled 2021-09-18: qty 10

## 2021-09-18 MED ORDER — SODIUM CHLORIDE 0.9 % IV SOLN
2.0000 g | INTRAVENOUS | Status: AC
  Administered 2021-09-18: 2 g via INTRAVENOUS
  Filled 2021-09-18: qty 2

## 2021-09-18 SURGICAL SUPPLY — 29 items
ADH SKN CLS APL DERMABOND .7 (GAUZE/BANDAGES/DRESSINGS) ×1
APL PRP STRL LF DISP 70% ISPRP (MISCELLANEOUS) ×2
BACTOSHIELD CHG 4% 4OZ (MISCELLANEOUS) ×1
CATH KIT ON-Q SILVERSOAK 5 (CATHETERS) ×4 IMPLANT
CATH KIT ON-Q SILVERSOAK 5IN (CATHETERS) ×4 IMPLANT
CHLORAPREP W/TINT 26 (MISCELLANEOUS) ×6 IMPLANT
DERMABOND ADVANCED (GAUZE/BANDAGES/DRESSINGS) ×1
DERMABOND ADVANCED .7 DNX12 (GAUZE/BANDAGES/DRESSINGS) ×2 IMPLANT
ELECT CAUTERY BLADE 6.4 (BLADE) ×3 IMPLANT
ELECT REM PT RETURN 9FT ADLT (ELECTROSURGICAL) ×2
ELECTRODE REM PT RTRN 9FT ADLT (ELECTROSURGICAL) ×2 IMPLANT
GLOVE SURG NEOPR MICRO LF SZ8 (GLOVE) ×3 IMPLANT
GOWN STRL REUS W/ TWL LRG LVL3 (GOWN DISPOSABLE) ×2 IMPLANT
GOWN STRL REUS W/ TWL XL LVL3 (GOWN DISPOSABLE) ×4 IMPLANT
GOWN STRL REUS W/TWL LRG LVL3 (GOWN DISPOSABLE) ×2
GOWN STRL REUS W/TWL XL LVL3 (GOWN DISPOSABLE) ×4
MANIFOLD NEPTUNE II (INSTRUMENTS) ×3 IMPLANT
MAT PREVALON FULL STRYKER (MISCELLANEOUS) ×3 IMPLANT
NS IRRIG 1000ML POUR BTL (IV SOLUTION) ×3 IMPLANT
PACK C SECTION AR (MISCELLANEOUS) ×3 IMPLANT
PAD OB MATERNITY 4.3X12.25 (PERSONAL CARE ITEMS) ×3 IMPLANT
PAD PREP 24X41 OB/GYN DISP (PERSONAL CARE ITEMS) ×3 IMPLANT
PENCIL SMOKE EVACUATOR (MISCELLANEOUS) ×3 IMPLANT
SCRUB CHG 4% DYNA-HEX 4OZ (MISCELLANEOUS) ×2 IMPLANT
SUT MAXON ABS #0 GS21 30IN (SUTURE) ×6 IMPLANT
SUT VIC AB 1 CT1 36 (SUTURE) ×9 IMPLANT
SUT VIC AB 2-0 CT1 36 (SUTURE) ×3 IMPLANT
SUT VIC AB 4-0 FS2 27 (SUTURE) ×3 IMPLANT
WATER STERILE IRR 500ML POUR (IV SOLUTION) ×3 IMPLANT

## 2021-09-18 NOTE — Op Note (Signed)
Cesarean Section Procedure Note Indications:  Prior Birth Trauma and term intrauterine pregnancy, Desire for permanent sterility  Pre-operative Diagnosis:  Prior birth trauma and term intrauterine pregnancy, Desire for permanent sterility, 39 weeks Post-operative Diagnosis: same, delivered. Procedure: Low Transverse Cesarean Section, Bilateral Tubal Ligation Surgeon: Barnett Applebaum, MD Assistant(s): Quillian Quince, RNFA Anesthesia: Spinal anesthesia Estimated Blood [id number] Complications: None; patient tolerated the procedure well. Disposition: PACU - hemodynamically stable. Condition: stable  Findings: A female infant in the cephalic presentation.  "Myrtha Mantis" Amniotic fluid - Clear  Birth weight 8-13 lbs.  Apgars of 8 and 9.  Intact placenta with a three-vessel cord. Grossly normal uterus, tubes and ovaries bilaterally. Some intraabdominal adhesions were noted in the posterior cul de sac (c/w endometriosis)  Procedure Details   The patient was taken to Operating Room, identified as the correct patient and the procedure verified as C-Section Delivery. A Time Out was held and the above information confirmed. After induction of anesthesia, the patient was draped and prepped in the usual sterile manner. A Pfannenstiel incision was made and carried down through the subcutaneous tissue to the fascia. Fascial incision was made and extended transversely with the Mayo scissors. The fascia was separated from the underlying rectus tissue superiorly and inferiorly. The peritoneum was identified and entered bluntly. Peritoneal incision was extended longitudinally. The utero-vesical peritoneal reflection was incised transversely and a bladder flap was created digitally.  A low transverse hysterotomy was made. The fetus was delivered atraumatically. The umbilical cord was clamped x2 and cut and the infant was handed to the awaiting pediatricians. The placenta was removed intact and appeared normal with a  3-vessel cord.  The uterus was exteriorized and cleared of all clot and debris. The hysterotomy was closed with running sutures of 0 Vicryl suture. A second imbricating layer was placed with the same suture. Excellent hemostasis was observed.   The left Fallopian tube was identified, grasped with the Babcock clamps, lifted to the skin incision and followed out distally to the fimbriae. An avascular midsection of the tube approximately 3-4cm from the cornua was grasped with the babcock clamps and brought into a knuckle at the skin incision. The tube was double ligated with 2-0 Vicryl suture and the intervening portion of tube was transected and removed. Excellent hemostasis was noted and the tube was returned to the abdomen. Attention was then turned to the right fallopian tube after confirmation of identification by tracing the tube out to the fimbriae. The same procedure was then performed on the right Fallopian tube. Again, excellent hemostasis was noted at the end of the procedure.  The uterus was returned to the abdomen. The pelvis was irrigated and again, excellent hemostasis was noted.  The On Q Pain pump System was then placed.  Trocars were placed through the abdominal wall into the subfascial space and these were used to thread the silver soaker cathaters into place.The rectus fascia was then reapproximated with running sutures of Maxon, with careful placement not to incorporate the cathaters. Subcutaneous tissues are then irrigated with saline and hemostasis assured.  Skin is then closed with 4-0 vicryl suture in a subcuticular fashion followed by skin adhesive. The cathaters are flushed each with 5 mL of Bupivicaine and stabilized into place with dressing. Instrument, sponge, and needle counts were correct prior to the abdominal closure and at the conclusion of the case.  The patient tolerated the procedure well and was transferred to the recovery room in stable condition.   Barnett Applebaum, MD,  Cherlynn June  [facility], [facility] 09/18/2021  9:19 AM

## 2021-09-18 NOTE — Anesthesia Procedure Notes (Signed)
Spinal  Patient location during procedure: OR Start time: 09/18/2021 8:15 AM End time: 09/18/2021 8:20 AM Reason for block: surgical anesthesia Staffing Performed: resident/CRNA  Anesthesiologist: Darrin Nipper, MD Resident/CRNA: Tollie Eth, CRNA Preanesthetic Checklist Completed: patient identified, IV checked, site marked, risks and benefits discussed, surgical consent, monitors and equipment checked, pre-op evaluation and timeout performed Spinal Block Patient position: sitting Prep: Betadine Patient monitoring: heart rate, continuous pulse ox, blood pressure and cardiac monitor Approach: midline Location: L4-5 Injection technique: single-shot Needle Needle type: Whitacre and Introducer  Needle gauge: 24 G Needle length: 9 cm Assessment Events: CSF return Additional Notes Negative paresthesia. Negative blood return. Positive free-flowing CSF. Expiration date of kit checked and confirmed. Patient tolerated procedure well, without complications.

## 2021-09-18 NOTE — Discharge Summary (Signed)
Postpartum Discharge Summary  Date of Service updated: 09/20/2021     Patient Name: Angela [name] DOB: [birth year] MRN: [id number]  Date of admission: 09/18/2021 Delivery date: 09/18/2021  Delivering provider: Gae Dry  Date of discharge: 09/20/2021  Admitting diagnosis: H/O birth trauma [Z87.898] Intrauterine pregnancy: [redacted]w[redacted]d    Secondary diagnosis:  Principal Problem:   H/O birth trauma Active Problems:   High-risk pregnancy, third trimester   Multigravida of advanced maternal age in second trimester   Postpartum care following cesarean delivery   Encounter for care or examination of lactating mother  Additional problems: None    Discharge diagnosis: Term Pregnancy Delivered                                              Post partum procedures: none Augmentation: N/A Complications: None  Hospital course: Sceduled C/S   46y.o. yo [id number] admitted to the hospital 09/18/2021 for scheduled cesarean section with the following indication: Prior Birth Trauma .Delivery details are as follows:  Membrane Rupture Time/Date: 8:32 AM ,09/18/2021   Delivery Method:C-Section, Low Transverse  Details of operation can be found in separate operative note.  Patient had an uncomplicated postpartum course.  She is ambulating, tolerating a regular diet, passing flatus, and urinating well. Patient is discharged home in stable condition on  09/20/21        Newborn Data: Birth date:09/18/2021  Birth time:8:33 AM  Gender:Female  Living status:Living  Apgars:8 ,9  Weight:3990 g     Magnesium Sulfate received: No BMZ received: No Rhophylac:N/A MMR:No T-DaP:Given prenatally Flu: No Transfusion:No  Physical exam  Vitals:   09/19/21 0800 09/19/21 1630 09/19/21 2315 09/20/21 0757  BP: 94/63 113/62 (!) 96/58 (!) 95/55  Pulse: 69 84 77 76  Resp: '18 20 17 20  ' Temp: 98.7 F (37.1 C) 98.5 F (36.9 C) 98.2 F (36.8 C) 97.8 F (36.6 C)  TempSrc: Oral Oral Oral Oral   SpO2: 98% 99% 99% 99%  Weight:      Height:       General: alert, cooperative, and no distress Lochia: appropriate Uterine Fundus: firm Incision: Healing well with no significant drainage, On Q intact DVT Evaluation: No evidence of DVT seen on physical exam. Labs: Lab Results  Component Value Date   WBC 12.2 (H) 09/19/2021   HGB 8.3 (L) 09/19/2021   HCT 26.0 (L) 09/19/2021   MCV 85.2 09/19/2021   PLT 301 09/19/2021   CMP Latest Ref Rng & Units 04/09/2021  Glucose 70 - 99 mg/dL 87  BUN 6 - 20 mg/dL 12  Creatinine 0.44 - 1.00 mg/dL 0.51  Sodium 135 - 145 mmol/L 136  Potassium 3.5 - 5.1 mmol/L 3.9  Chloride 98 - 111 mmol/L 104  CO2 22 - 32 mmol/L 26  Calcium 8.9 - 10.3 mg/dL 9.0   Edinburgh Score: Edinburgh Postnatal Depression Scale Screening Tool 09/19/2021  I have been able to laugh and see the funny side of things. 0  I have looked forward with enjoyment to things. 0  I have blamed myself unnecessarily when things went wrong. 0  I have been anxious or worried for no good reason. 1  I have felt scared or panicky for no good reason. 0  Things have been getting on top of me. 0  I have been so unhappy that  I have had difficulty sleeping. 0  I have felt sad or miserable. 0  I have been so unhappy that I have been crying. 0  The thought of harming myself has occurred to me. 0  Edinburgh Postnatal Depression Scale Total 1      After visit meds:  Allergies as of 09/20/2021   No Known Allergies      Medication List     STOP taking these medications    aspirin 81 MG chewable tablet       TAKE these medications    clotrimazole-betamethasone cream Commonly known as: LOTRISONE Apply 1 application topically daily as needed (irritation).   [facility] Prenatal 28-0.8 MG Tabs Take 1 tablet by mouth daily.   oxyCODONE 5 MG immediate release tablet Commonly known as: Oxy IR/ROXICODONE Take 1 tablet (5 mg total) by mouth every 6 (six) hours as needed for up to 5 days for  severe pain.               Discharge Care Instructions  (From admission, onward)           Start     Ordered   09/20/21 0000  Discharge wound care:       Comments: Keep incision dry, clean.   09/20/21 1133             Discharge home in stable condition Infant Feeding: Breast Infant Disposition:home with mother Discharge instruction: per After Visit Summary and Postpartum booklet. Activity: Advance as tolerated. Pelvic rest for 6 weeks.  Diet: routine diet Anticipated Birth Control: BTL done PP Postpartum Appointment:6 weeks Additional Postpartum F/U: Incision check 1 week Future Appointments: Future Appointments  Date Time Provider [place]  09/28/2021  9:15 AM Gae Dry, MD [facility] None  10/19/2021  4:40 PM [facility] GV-1 [facility] [place]  10/28/2021  4:00 PM Ralene Bathe, MD [facility] None   Follow up Visit:  Follow-up Information     Gae Dry, MD. Go in 2 week(s).   Specialty: Obstetrics and Gynecology Why: For Post Op Contact information: [address] [place] [address] [phone]                  09/20/2021 Rod Can, CNM

## 2021-09-18 NOTE — Transfer of Care (Signed)
Immediate Anesthesia Transfer of Care Note  Patient: Angela [name]  Procedure(s) Performed: CESAREAN SECTION WITH BILATERAL TUBAL LIGATION RNFA (Bilateral)  Patient Location: Mother/Baby  Anesthesia Type:Spinal  Level of Consciousness: awake, alert  and oriented  Airway & Oxygen Therapy: Patient Spontanous Breathing  Post-op Assessment: Report given to RN and Post -op Vital signs reviewed and stable  Post vital signs: Reviewed and stable  Last Vitals:  Vitals Value Taken Time  BP 106/66   Temp 96.41f   Pulse 94   Resp 14   SpO2 100     Last Pain:  Vitals:   09/18/21 0530  TempSrc: Oral         Complications: [name] notable events documented.

## 2021-09-18 NOTE — Anesthesia Preprocedure Evaluation (Addendum)
Anesthesia Evaluation  Patient identified by MRN, date of birth, ID band Patient awake    Reviewed: Allergy & Precautions, NPO status , Patient's Chart, lab work & pertinent test results  History of Anesthesia Complications (+) PONV and history of anesthetic complications  Airway Mallampati: III   Neck ROM: Full    Dental no notable dental hx.    Pulmonary neg pulmonary ROS,    Pulmonary exam normal breath sounds clear to auscultation       Cardiovascular Exercise Tolerance: Good negative cardio ROS Normal cardiovascular exam Rhythm:Regular Rate:Normal     Neuro/Psych negative neurological ROS     GI/Hepatic Reflux with pregnancy   Endo/Other  Obesity   Renal/GU      Musculoskeletal Skin BCC   Abdominal   Peds  Hematology negative hematology ROS (+)   Anesthesia Other Findings 46 yo [id number] presenting for c-section and BTL with hx prior birth trauma.  Reproductive/Obstetrics                            Anesthesia Physical Anesthesia Plan  ASA: 2  Anesthesia Plan: Spinal   Post-op Pain Management:    Induction: Intravenous  PONV Risk Score and Plan: 3 and Treatment may vary due to age or medical condition and Ondansetron  Airway Management Planned: Natural Airway  Additional Equipment:   Intra-op Plan:   Post-operative Plan:   Informed Consent: I have reviewed the patients History and Physical, chart, labs and discussed the procedure including the risks, benefits and alternatives for the proposed anesthesia with the patient or authorized representative who has indicated his/her understanding and acceptance.     Dental Advisory Given  Plan Discussed with: CRNA  Anesthesia Plan Comments: (Plan for spinal, GETA backup.  Patient consented for risks of anesthesia including but not limited to:  - adverse reactions to medications - damage to eyes, teeth, lips or other oral  mucosa - nerve damage due to positioning  - sore throat or hoarseness - headache, bleeding, infection, nerve damage 2/2 spinal - damage to heart, brain, nerves, lungs, other parts of body or loss of life  Informed patient about role of CRNA in peri- and intra-operative care.  Patient voiced understanding.)       Anesthesia Quick Evaluation

## 2021-09-18 NOTE — Progress Notes (Signed)
Admit Date: 09/18/2021 Today's Date: 09/18/2021  Subjective: Postpartum Day 0: Cesarean Delivery Patient reports incisional pain and tolerating PO.    Objective: Vital signs in last 24 hours: Temp:  [97.6 F (36.4 C)-98.7 F (37.1 C)] 97.9 F (36.6 C) (02/24 1400) Pulse Rate:  [73-113] 78 (02/24 1400) Resp:  [8-30] 20 (02/24 1400) BP: (97-115)/(59-97) 103/63 (02/24 1400) SpO2:  [94 %-100 %] 100 % (02/24 1400) Weight:  [93.9 kg] 93.9 kg (02/24 1130)  Physical Exam:  General: alert, cooperative, and no distress Lochia: appropriate Uterine Fundus: firm Incision: healing well, no significant drainage, no dehiscence, no significant erythema DVT Evaluation: No evidence of DVT seen on physical exam. Negative Homan's sign. No cords or calf tenderness. No significant calf/ankle edema.  Recent Labs    09/16/21 0910  HGB 9.7*  HCT 30.1*    Assessment/Plan: Status post Cesarean section. Doing well postoperatively.  Continue current care.  Hoyt Koch 09/18/2021, 2:06 PM

## 2021-09-18 NOTE — Lactation Note (Signed)
This note was copied from a baby's chart. Lactation Consultation Note  Patient Name: Angela [name] "Angela [name]"  Today's Date: 09/18/2021 Reason for consult: Initial assessment Age:44 hours  Maternal Data Has patient been taught Hand Expression?: Yes Does the patient have breastfeeding experience prior to this delivery?: Yes How long did the patient breastfeed?: ~ year (back to work at 12 weeks so pumped then) (some supply issues towards end of siblings first year (supplemented minimal)) Sibling 53 yr old   Feeding Mother's Current Feeding Choice: Breast Milk  LATCH Score  No feeding observed, baby sleeping.  Mother reports 2 good feeds ~ 30 min each and no discomfort with good latch, rhythmic sucking with pauses and swallowing (she thinks she heard) - see RN latch score of 9 previous; mother reports rounded soft nipple and baby appearing content.   Lactation Tools Discussed/Used  Mother has pump for home use - discussed that no return to work for [employer] and will be working from home therefore separation not an issue for now.  Mother also has manual pump. Reviewed hand expression to use as needed today.  Spoons (and collection container) given and finger feeding explained for as needed.  Cluster feeding discussed in anticipation.   Interventions  As stated above. Encouraged to call for feeding observation to assure latch and positioning ideal though no concerns reported.   Discharge Pump: Personal (has manual and electric as well as collecter) WIC Program: No Recommended check flange sizing prior to discharge (c-s).   Consult Status Consult Status: Follow-up Date: 09/18/21 Follow-up type: In-patient (name and number on white board - to call for assistance as needed) Outpatient resources also discussed.   Lactation to the room for initial visit. Encouraged feeding on demand and with cues. If baby is not cueing encouraged hand expression and skin to skin. Taught proper  technique for hand expression and spoon feeding. Encouraged 8 or more attempts in the first 24 hours and 8 or more good feeds after 24 HOL. Reviewed appropriate diapers for days of life and How to know your baby is getting enough to eat. Reviewed "Understanding Postpartum and Newborn Care" booklet at bedside. [facility] # left on board, encouraged to call for any assistance. Mother has no further questions at this time.   Grandmother at bedside as well.  Dionne Milo An Shevy Yaney 09/18/2021, 3:28 PM

## 2021-09-18 NOTE — H&P (Signed)
PRE-OPERATIVE HISTORY AND PHYSICAL EXAM  HPI:  Angela [name] is a 46 y.o. [id number].  Patient's last menstrual period was 12/19/2020.  [redacted]w[redacted]d Estimated Date of Delivery: 09/25/21  She is being admitted for  CS as has had prior birth trauma.  Pregnancy (donor egg) by IVF.  AMA.  PMHx: She  has a past medical history of Basal cell carcinoma (01/20/2016), BCC (basal cell carcinoma of skin) (04/28/2021), Family history of ovarian cancer (03/2021), and PONV (postoperative nausea and vomiting). Also,  has a past surgical history that includes Augmentation mammaplasty (Bilateral, 2013)., family history includes Ovarian cancer (age of onset: 54) in her paternal grandmother.,  reports that she has never smoked. She has never used smokeless tobacco. She reports that she does not currently use alcohol. She reports that she does not use drugs. OB History  Gravida Para Term Preterm AB Living  4 1 1   2 1   SAB IAB Ectopic Multiple Live Births  2            # Outcome Date GA Lbr Len/2nd Weight Sex Delivery Anes PTL Lv  4 Current           3 SAB 11/24/17          2 SAB           1 Term           Patient denies any other pertinent gynecologic issues. See prenatal record for more complete H&P   Current Facility-Administered Medications:    cefOXitin (MEFOXIN) 2 g in sodium chloride 0.9 % 100 mL IVPB, 2 g, Intravenous, On Call to OR, Gae Dry, MD   lactated ringers infusion, , Intravenous, Continuous, Darrin Nipper, MD, Last Rate: 10 mL/hr at 09/18/21 0609, New Bag at 09/18/21 0609   Oxytocin-Sodium Chloride 30-0.9 UT/500ML-% infusion, , , ,    sodium citrate-citric acid (ORACIT) 500-334 MG/5ML solution, , , ,  Also, has No Known Allergies.  Review of Systems  Constitutional:  Negative for chills, fever and malaise/fatigue.  HENT:  Negative for congestion, sinus pain and sore throat.   Eyes:  Negative for blurred vision and pain.  Respiratory:  Negative for cough and wheezing.    Cardiovascular:  Negative for chest pain and leg swelling.  Gastrointestinal:  Negative for abdominal pain, constipation, diarrhea, heartburn, nausea and vomiting.  Genitourinary:  Negative for dysuria, frequency, hematuria and urgency.  Musculoskeletal:  Negative for back pain, joint pain, myalgias and neck pain.  Skin:  Negative for itching and rash.  Neurological:  Negative for dizziness, tremors and weakness.  Endo/Heme/Allergies:  Does not bruise/bleed easily.  Psychiatric/Behavioral:  Negative for depression. The patient is not nervous/anxious and does not have insomnia.    Objective: BP 109/66 (BP Location: Right Arm)    Pulse 83    Temp 98.7 F (37.1 C) (Oral)    Resp 16    LMP 12/19/2020    SpO2 96%  There were no vitals filed for this visit. Physical Exam Constitutional:      General: She is not in acute distress.    Appearance: She is well-developed.  HENT:     Head: Normocephalic and atraumatic. No laceration.     Right Ear: Hearing normal.     Left Ear: Hearing normal.     Mouth/Throat:     Pharynx: Uvula midline.  Eyes:     Pupils: Pupils are equal, round, and reactive to light.  Neck:     Thyroid: No thyromegaly.  Cardiovascular:     Rate and Rhythm: Normal rate and regular rhythm.     Heart sounds: No murmur heard.   No friction rub. No gallop.  Pulmonary:     Effort: Pulmonary effort is normal. No respiratory distress.     Breath sounds: Normal breath sounds. No wheezing.  Abdominal:     General: Bowel sounds are normal. There is no distension.     Palpations: Abdomen is soft.     Tenderness: There is no abdominal tenderness. There is no rebound.  Musculoskeletal:        General: Normal range of motion.     Cervical back: Normal range of motion and neck supple.  Neurological:     Mental Status: She is alert and oriented to person, place, and time.     Cranial Nerves: No cranial nerve deficit.  Skin:    General: Skin is warm and dry.  Psychiatric:         Judgment: Judgment normal.  Vitals reviewed.    Assessment: [id number] WF at 39 weeks for CS and BTL related to prior birth trauma at term pregnancy  PLAN: 1.  Cesarean Delivery w BTL as Scheduled.  Patient will undergo surgical management with Cesarean Section.   The risks of surgery were discussed in detail with the patient including but not limited to: bleeding which may require transfusion or reoperation; infection which may require antibiotics; injury to surrounding organs which may involve bowel, bladder, ureters ; need for additional procedures including laparoscopy or laparotomy; thromboembolic phenomenon, surgical site problems and other postoperative/anesthesia complications. Likelihood of success in alleviating the patient's condition was discussed. Routine postoperative instructions will be reviewed with the patient and her family in detail after surgery.  The patient concurred with the proposed plan, giving informed written consent for the surgery.  Patient will be NPO procedure.  Preoperative prophylactic antibiotics, as necessary, and SCDs ordered on call to the OR.  The patient has been fully informed about all methods of contraception, both temporary and permanent. She understands that tubal ligation is meant to be permanent, absolute and irreversible. She was told that there is an approximately 1 in 400 chance of a pregnancy in the future after tubal ligation. She was told the short and long term complications of tubal ligation. She understands the risks from this surgery include, but are not limited to, the risks of anesthesia, hemorrhage, infection, perforation, and injury to adjacent structures, bowel, bladder and blood vessels.   Barnett Applebaum, M.D. 09/18/2021 6:36 AM

## 2021-09-19 LAB — CBC
HCT: 26 % — ABNORMAL LOW (ref 36.0–46.0)
Hemoglobin: 8.3 g/dL — ABNORMAL LOW (ref 12.0–15.0)
MCH: 27.2 pg (ref 26.0–34.0)
MCHC: 31.9 g/dL (ref 30.0–36.0)
MCV: 85.2 fL (ref 80.0–100.0)
Platelets: 301 10*3/uL (ref 150–400)
RBC: 3.05 MIL/uL — ABNORMAL LOW (ref 3.87–5.11)
RDW: 14.9 % (ref 11.5–15.5)
WBC: 12.2 10*3/uL — ABNORMAL HIGH (ref 4.0–10.5)
nRBC: 0 % (ref 0.0–0.2)

## 2021-09-19 MED ORDER — IBUPROFEN 600 MG PO TABS
600.0000 mg | ORAL_TABLET | Freq: Four times a day (QID) | ORAL | Status: DC
  Administered 2021-09-19 – 2021-09-20 (×6): 600 mg via ORAL
  Filled 2021-09-19 (×6): qty 1

## 2021-09-19 NOTE — Anesthesia Post-op Follow-up Note (Signed)
°  Anesthesia Pain Follow-up Note  Patient: JERILYN GILLASPIE  Day #: 1  Date of Follow-up: 09/19/2021 Time: 12:53 PM  Last Vitals:  Vitals:   09/19/21 0700 09/19/21 0800  BP:  94/63  Pulse:  69  Resp:  18  Temp:  37.1 C  SpO2: 99% 98%    Level of Consciousness: alert  Pain: none   Side Effects:None  Catheter Site Exam:clean, dry     Plan: D/C from anesthesia care at surgeon's request  Martha Clan

## 2021-09-19 NOTE — Progress Notes (Signed)
Obstetric Postpartum/PostOperative Daily Progress Note Subjective:  46 y.o. [id number] post-operative day # 1 status post primary cesarean delivery.  She is ambulating, is tolerating po, is voiding spontaneously.  Her pain is well controlled on PO pain medications and On Q pump. Her lochia is less than menses. She reports breastfeeding is going well.   Medications SCHEDULED MEDICATIONS   ibuprofen  600 mg Oral Q6H   prenatal multivitamin  1 tablet Oral Q1200   scopolamine  1 patch Transdermal Once   senna-docusate  2 tablet Oral Q24H   simethicone  80 mg Oral TID PC    MEDICATION INFUSIONS   bupivacaine ON-Q pain pump     lactated ringers Stopped (09/18/21 2113)   naLOXone (NARCAN) adult infusion for PRURITIS      PRN MEDICATIONS  acetaminophen, coconut oil, witch hazel-glycerin **AND** dibucaine, diphenhydrAMINE **OR** diphenhydrAMINE, diphenhydrAMINE, menthol-cetylpyridinium, morphine injection, naloxone **AND** sodium chloride flush, naLOXone (NARCAN) adult infusion for PRURITIS, ondansetron (ZOFRAN) IV, oxyCODONE, simethicone, zolpidem    Objective:   Vitals:   09/19/21 0508 09/19/21 0600 09/19/21 0700 09/19/21 0800  BP: 105/67   94/63  Pulse: 81   69  Resp: 18   18  Temp: 97.9 F (36.6 C)   98.7 F (37.1 C)  TempSrc: Oral   Oral  SpO2: 100% 99% 99% 98%  Weight:      Height:        Current Vital Signs 24h Vital Sign Ranges  T 98.7 F (37.1 C) Temp  Avg: 98.1 F (36.7 C)  Min: 97.5 F (36.4 C)  Max: 98.7 F (37.1 C)  BP 94/63 BP  Min: 94/63  Max: 115/97  HR 69 Pulse  Avg: 81.8  Min: 69  Max: 100  RR 18 Resp  Avg: 16.3  Min: 8  Max: 30  SaO2 98 % Room Air SpO2  Avg: 99.5 %  Min: 95 %  Max: 100 %       24 Hour I/O Current Shift I/O  Time Ins Outs 02/24 0701 - 02/25 0700 In: 1415.3 [I.V.:615.3] Out: 2360 [Urine:1770] No intake/output data recorded.  General: NAD Pulmonary: no increased work of breathing Abdomen: non-distended, non-tender, fundus firm at level of  umbilicus Inc: Clean/dry/intact, On Q intact Extremities: no edema, no erythema, no tenderness  Labs:  Recent Labs  Lab 09/16/21 0910 09/19/21 0647  WBC 7.8 12.2*  HGB 9.7* 8.3*  HCT 30.1* 26.0*  PLT 305 301     Assessment:   46 y.o. [id number] postoperative day # 1 status post primary cesarean section, lactating  Plan:  1) Acute blood loss anemia - hemodynamically stable and asymptomatic - po ferrous sulfate  2) O POS / Rubella 10.80 (08/08 1443)/ Varicella Immune  3) TDAP status given antepartum  4) breast /Contraception = bilateral tubal ligation  5) Disposition: continue current care   Rod Can, CNM 09/19/2021 10:13 AM

## 2021-09-19 NOTE — Anesthesia Postprocedure Evaluation (Signed)
Anesthesia Post Note  Patient: SOPHIEA UEDA  Procedure(s) Performed: CESAREAN SECTION WITH BILATERAL TUBAL LIGATION RNFA (Bilateral)  Patient location during evaluation: Mother Baby Anesthesia Type: Spinal Level of consciousness: oriented and awake and alert Pain management: pain level controlled Vital Signs Assessment: post-procedure vital signs reviewed and stable Respiratory status: spontaneous breathing and respiratory function stable Cardiovascular status: blood pressure returned to baseline and stable Postop Assessment: no headache, no backache, no apparent nausea or vomiting, able to ambulate and adequate PO intake Anesthetic complications: no   No notable events documented.   Last Vitals:  Vitals:   09/19/21 0700 09/19/21 0800  BP:  94/63  Pulse:  69  Resp:  18  Temp:  37.1 C  SpO2: 99% 98%    Last Pain:  Vitals:   09/19/21 1127  TempSrc:   PainSc: 3                  Martha Clan

## 2021-09-20 MED ORDER — OXYCODONE HCL 5 MG PO TABS: 5.0000 mg | ORAL_TABLET | Freq: Four times a day (QID) | ORAL | 0 refills | Status: AC | PRN

## 2021-09-20 NOTE — Progress Notes (Signed)
Pt discharged with infant.  Discharge instructions, prescriptions and follow up appointment given to and reviewed with pt. Pt verbalized understanding. Escorted out by auxillary. 

## 2021-09-21 ENCOUNTER — Encounter: Payer: Self-pay | Admitting: Obstetrics & Gynecology

## 2021-09-21 LAB — SURGICAL PATHOLOGY

## 2021-09-22 ENCOUNTER — Encounter: Payer: Self-pay | Admitting: Obstetrics & Gynecology

## 2021-09-22 ENCOUNTER — Ambulatory Visit (INDEPENDENT_AMBULATORY_CARE_PROVIDER_SITE_OTHER): Payer: BC Managed Care – PPO | Admitting: Obstetrics & Gynecology

## 2021-09-22 ENCOUNTER — Other Ambulatory Visit: Payer: Self-pay

## 2021-09-22 VITALS — BP 120/80 | Ht 63.0 in | Wt 199.0 lb

## 2021-09-22 DIAGNOSIS — Z98891 History of uterine scar from previous surgery: Secondary | ICD-10-CM

## 2021-09-22 LAB — FETAL NONSTRESS TEST

## 2021-09-22 NOTE — Progress Notes (Signed)
°  Postoperative Follow-up Patient presents post op from recent Cesarean Section performed for  elective, history of birth trauma , 1 week ago.   Subjective: Patient reports  rash on abdomen (where skin prep was used), mood instability (Edinburgh 18), edema of lower extremities, constipation, and sore nipples from breast feeding.  Eating a regular diet without difficulty. Pain is controlled with current analgesics. Medications being used: ibuprofen (OTC) and narcotic analgesics including Percocet.  Activity: sedentary. Patient reports additional symptom's since surgery of appropriate lochia, no signs of depression, and no signs of mastitis.  Objective: BP 120/80    Ht 5\' 3"  (1.6 m)    Wt 199 lb (90.3 kg)    Breastfeeding Yes    BMI 35.25 kg/m  Physical Exam Constitutional:      General: She is not in acute distress.    Appearance: She is well-developed.  Cardiovascular:     Rate and Rhythm: Normal rate.  Pulmonary:     Effort: Pulmonary effort is normal.  Abdominal:     General: There is no distension.     Palpations: Abdomen is soft.     Tenderness: There is no abdominal tenderness.     Comments: Incision Healing Well   Musculoskeletal:        General: Normal range of motion.  Neurological:     Mental Status: She is alert and oriented to person, place, and time.     Cranial Nerves: No cranial nerve deficit.  Skin:    General: Skin is warm and dry.     Comments: Rash distribution mirrors area of CHD skin prep for CS    Assessment: s/p : Cesarean Section for  prior h/o birth trauma  stable  Plan: Patient has done well after her Cesarean Section with no apparent complications.  I have discussed the post-operative course to date, and the expected progress moving forward.  The patient understands what complications to be concerned about.  I will see the patient in routine follow up, or sooner if needed.    Activity plan: No heavy lifting.Marland Kitchen  Pelvic rest. She has bilateral tubal  ligation for postpartum contraception. Monitor for PPD, too early to dx just yet Benadryl and lotion for rash.  Steroids if worsens. Monitor edema, counseled should resolve soon  Hoyt Koch 09/22/2021, 2:21 PM

## 2021-09-23 ENCOUNTER — Encounter: Payer: Self-pay | Admitting: Obstetrics & Gynecology

## 2021-09-28 ENCOUNTER — Ambulatory Visit (INDEPENDENT_AMBULATORY_CARE_PROVIDER_SITE_OTHER): Payer: BC Managed Care – PPO | Admitting: Obstetrics & Gynecology

## 2021-09-28 ENCOUNTER — Other Ambulatory Visit: Payer: Self-pay

## 2021-09-28 ENCOUNTER — Encounter: Payer: Self-pay | Admitting: Obstetrics & Gynecology

## 2021-09-28 VITALS — BP 118/62 | Ht 63.0 in | Wt 189.0 lb

## 2021-09-28 DIAGNOSIS — Z98891 History of uterine scar from previous surgery: Secondary | ICD-10-CM

## 2021-09-28 MED ORDER — OXYCODONE-ACETAMINOPHEN 5-325 MG PO TABS: 1.0000 | ORAL_TABLET | ORAL | 0 refills | Status: DC | PRN

## 2021-09-28 NOTE — Progress Notes (Signed)
?  Postoperative Follow-up ?Patient presents post op from recent Cesarean Section performed for  elective reasons , 1 week ago. ? ? ?Subjective: ?Patient reports marked improvement in her immediate post op symptoms, better rash and edema, and pain. Eating a regular diet without difficulty. Pain is controlled with current analgesics. Medications being used: ibuprofen (OTC) and narcotic analgesics including Percocet.  Activity: normal activities of daily living. Patient reports additional symptom's since surgery of appropriate lochia, no signs of depression, and no signs of mastitis. ? ?Objective: ?BP 118/62   Ht '5\' 3"'$  (1.6 m)   Wt 189 lb (85.7 kg)   Breastfeeding Yes   BMI 33.48 kg/m?  ?Physical Exam ?Constitutional:   ?   General: She is not in acute distress. ?   Appearance: She is well-developed.  ?Genitourinary:  ?   Rectum normal.  ?   Genitourinary Comments: Cervix and uterus absent. ?Vaginal cuff healing well.  ?   No vaginal erythema or bleeding.  ? ?   Right Adnexa: not tender and no mass present. ?   Left Adnexa: not tender and no mass present. ?   Cervix is absent.  ?   Uterus is absent.  ?Cardiovascular:  ?   Rate and Rhythm: Normal rate.  ?Pulmonary:  ?   Effort: Pulmonary effort is normal.  ?Abdominal:  ?   General: There is no distension.  ?   Palpations: Abdomen is soft.  ?   Tenderness: There is no abdominal tenderness.  ?   Comments: Incision healing well.  ?Musculoskeletal:     ?   General: Normal range of motion.  ?Neurological:  ?   Mental Status: She is alert and oriented to person, place, and time.  ?   Cranial Nerves: No cranial nerve deficit.  ?Skin: ?   General: Skin is warm and dry.  ? ? ?Assessment: ?s/p : Cesarean Section for  history of birth trauma  progressing well ? ?Plan: ?Patient has done well after her Cesarean Section with no apparent complications.  I have discussed the post-operative course to date, and the expected progress moving forward.  The patient understands what  complications to be concerned about.  I will see the patient in routine follow up, or sooner if needed.   ? ?Activity plan: No heavy lifting.Marland Kitchen  Pelvic rest. ?She has had bilateral tubal ligation for postpartum contraception. ?Edinburgh 3; no s/sx PPD ? ?Hoyt Koch ?09/28/2021, 10:06 AM ? ? ?

## 2021-10-19 ENCOUNTER — Other Ambulatory Visit: Payer: Self-pay

## 2021-10-19 ENCOUNTER — Ambulatory Visit
Admission: RE | Admit: 2021-10-19 | Discharge: 2021-10-19 | Disposition: A | Discharge status: Home / Self Care | Payer: BC Managed Care – PPO | Source: Ambulatory Visit | Attending: Obstetrics & Gynecology | Admitting: Obstetrics & Gynecology

## 2021-10-19 DIAGNOSIS — Z1231 Encounter for screening mammogram for malignant neoplasm of breast: Secondary | ICD-10-CM | POA: Diagnosis present

## 2021-10-26 ENCOUNTER — Encounter: Payer: Self-pay | Admitting: Obstetrics & Gynecology

## 2021-10-26 ENCOUNTER — Ambulatory Visit (INDEPENDENT_AMBULATORY_CARE_PROVIDER_SITE_OTHER): Payer: BC Managed Care – PPO | Admitting: Obstetrics & Gynecology

## 2021-10-26 NOTE — Progress Notes (Signed)
?  OBSTETRICS POSTPARTUM CLINIC PROGRESS NOTE ? ?Subjective:  ?  ? Angela [name] is a 46 y.o. [phone] female who presents for a postpartum visit. She is 6 weeks postpartum following a Term pregnancy, Single fetus, or Uncomplicated pregnancy and delivery by C-section.  I have fully reviewed the prenatal and intrapartum course. ?Anesthesia: spinal.  ?Postpartum course has been complicated by uncomplicated.  ?Baby is feeding by Bottle and Breast.  ?Bleeding: patient has not  resumed menses.  ?Bowel function is normal. Bladder function is normal.  ?Patient is not sexually active. Contraception method desired is tubal ligation.  ?Postpartum depression screening: negative. Edinburgh 2. ? ?The following portions of the patient's history were reviewed and updated as appropriate: allergies, current medications, past family history, past medical history, past social history, past surgical history, and problem list. ? ?Review of Systems ?Pertinent items are noted in HPI. ? ?Objective:  ? ? BP 100/70   Ht '5\' 3"'$  (1.6 m)   Wt 188 lb (85.3 kg)   Breastfeeding Yes   BMI 33.30 kg/m?   ?General:  alert and no distress  ? Breasts:  inspection negative, no nipple discharge or bleeding, no masses or nodularity palpable  ?Lungs: clear to auscultation bilaterally  ?Heart:  regular rate and rhythm, S1, S2 normal, no murmur, click, rub or gallop  ?Abdomen: soft, non-tender; bowel sounds normal; no masses,  no organomegaly.  Well healed Pfannenstiel incision  ? Vulva:  normal  ?Vagina: normal vagina, no discharge, exudate, lesion, or erythema  ?Cervix:  no cervical motion tenderness and no lesions  ?Corpus: normal size, contour, position, consistency, mobility, non-tender  ?Adnexa:  normal adnexa and no mass, fullness, tenderness  ?Rectal Exam: Not performed.  ?      ? ? ?Assessment:  ?Post Partum Care visit ?1. Postpartum care and examination ? ?Plan:  ?See orders and Patient Instructions ?Resume all normal activities ?Follow up in:  6 months or as needed.  ? ?Barnett Applebaum, MD, [place] ?[facility], [place] ?10/26/2021  10:00 AM ? ?

## 2021-10-26 NOTE — Patient Instructions (Signed)
This is the new practice information you requested: ? ?Dr. [facility] ?[address] ?[address] ?[place], [place]  [address] ?[phone] ? ?

## 2021-10-28 ENCOUNTER — Ambulatory Visit (INDEPENDENT_AMBULATORY_CARE_PROVIDER_SITE_OTHER): Payer: BC Managed Care – PPO | Admitting: Dermatology

## 2021-10-28 DIAGNOSIS — Z85828 Personal history of other malignant neoplasm of skin: Secondary | ICD-10-CM | POA: Diagnosis not present

## 2021-10-28 DIAGNOSIS — L308 Other specified dermatitis: Secondary | ICD-10-CM

## 2021-10-28 DIAGNOSIS — L821 Other seborrheic keratosis: Secondary | ICD-10-CM

## 2021-10-28 DIAGNOSIS — L820 Inflamed seborrheic keratosis: Secondary | ICD-10-CM

## 2021-10-28 MED ORDER — MOMETASONE FUROATE 0.1 % EX CREA: 1.0000 "application " | TOPICAL_CREAM | Freq: Every day | CUTANEOUS | 2 refills | Status: AC | PRN

## 2021-10-28 NOTE — Patient Instructions (Signed)

## 2021-10-28 NOTE — Progress Notes (Signed)
? ?  Follow-Up Visit ?  ?Subjective  ?Angela [name] is a 46 y.o. female who presents for the following: Follow-up (Biopsy follow up of right temple - BCC treated with EDC) and Other (Skin tags of neck/Mole of right breast /Rough spot of back). ?The patient has spots, moles and lesions to be evaluated, some may be new or changing and the patient has concerns that these could be cancer. ? ?The following portions of the chart were reviewed this encounter and updated as appropriate:  ? Tobacco  Allergies  Meds  Problems  Med Hx  Surg Hx  Fam Hx   ?  ?Review of Systems:  No other skin or systemic complaints except as noted in HPI or Assessment and Plan. ? ?Objective  ?Well appearing patient in no apparent distress; mood and affect are within normal limits. ? ?A focused examination was performed including face, neck, right breast, back. Relevant physical exam findings are noted in the Assessment and Plan. ? ?Right Temple ?Well healed EDC site ? ?Neck ?Erythematous stuck-on, waxy papule or plaque ? ?Right lat breast ?Stuck-on, waxy, tan-brown papule or plaque --Discussed benign etiology and prognosis.  ? ?Lower Back ?Pink scaly patch ? ? ?Assessment & Plan  ?History of basal cell carcinoma (BCC) ?Right Temple ?Clear today. Will plan Fluorouracil 5%/Calcipotriene cream once she has finished breast feeding. ? ?Inflamed seborrheic keratosis ?Neck ?Irritated ?Will plan LN2 on follow up. ? ?Seborrheic keratosis ?Right lat breast ?Benign-appearing.  Observation.  Call clinic for new or changing lesions.  Recommend daily use of broad spectrum spf 30+ sunscreen to sun-exposed areas.  ? ?Other eczema /atopic dermatitis ?Lower Back ?Atopic dermatitis (eczema) is a chronic, relapsing, pruritic condition that can significantly affect quality of life. It is often associated with allergic rhinitis and/or asthma and can require treatment with topical medications, phototherapy, or in severe cases biologic injectable medication  (Dupixent; Adbry) or Oral JAK inhibitors. ? ?mometasone (ELOCON) 0.1 % cream - Lower Back ?Apply 1 application. topically daily as needed (Rash). ? ?Return for ISK treatment. ? ?I, Ashok Cordia, CMA, am acting as scribe for Sarina Ser, MD . ?Documentation: I have reviewed the above documentation for accuracy and completeness, and I agree with the above. ? ?Sarina Ser, MD ? ?

## 2021-10-29 ENCOUNTER — Other Ambulatory Visit: Payer: Self-pay | Admitting: Obstetrics & Gynecology

## 2021-10-29 ENCOUNTER — Encounter: Payer: Self-pay | Admitting: Dermatology

## 2021-10-29 ENCOUNTER — Encounter: Payer: Self-pay | Admitting: Obstetrics & Gynecology

## 2021-10-29 MED ORDER — METOCLOPRAMIDE HCL 10 MG PO TABS
10.0000 mg | ORAL_TABLET | Freq: Four times a day (QID) | ORAL | 5 refills | Status: AC | PRN
Start: 2021-10-29 — End: ?

## 2021-10-29 MED ORDER — NORETHINDRONE 0.35 MG PO TABS: 1.0000 | ORAL_TABLET | Freq: Every day | ORAL | 11 refills | Status: DC

## 2021-12-24 ENCOUNTER — Ambulatory Visit (INDEPENDENT_AMBULATORY_CARE_PROVIDER_SITE_OTHER): Payer: BC Managed Care – PPO | Admitting: Dermatology

## 2021-12-24 DIAGNOSIS — L820 Inflamed seborrheic keratosis: Secondary | ICD-10-CM

## 2021-12-24 DIAGNOSIS — Z85828 Personal history of other malignant neoplasm of skin: Secondary | ICD-10-CM

## 2021-12-24 DIAGNOSIS — L578 Other skin changes due to chronic exposure to nonionizing radiation: Secondary | ICD-10-CM | POA: Diagnosis not present

## 2021-12-24 NOTE — Patient Instructions (Addendum)

## 2021-12-24 NOTE — Progress Notes (Signed)
   Follow-Up Visit   Subjective  Angela [name] is a 46 y.o. female who presents for the following: ISKs to treat (Neck, irritating). The patient has spots, moles and lesions to be evaluated, some may be new or changing and the patient has concerns that these could be cancer.  The following portions of the chart were reviewed this encounter and updated as appropriate:   Tobacco  Allergies  Meds  Problems  Med Hx  Surg Hx  Fam Hx     Review of Systems:  No other skin or systemic complaints except as noted in HPI or Assessment and Plan.  Objective  Well appearing patient in no apparent distress; mood and affect are within normal limits.  A focused examination was performed including neck. Relevant physical exam findings are noted in the Assessment and Plan.  neck x 22 (22) Stuck on waxy paps with erythema   Assessment & Plan   History of Basal Cell Carcinoma of the Skin - Plan Adjunct field treatment with 5-FU/calcipotriene at next visit to right temple - No evidence of recurrence today - Recommend regular full body skin exams - Recommend daily broad spectrum sunscreen SPF 30+ to sun-exposed areas, reapply every 2 hours as needed.  - Call if any new or changing lesions are noted between office visits  - R temple, clear but plan field treatment on f/u  Actinic Damage - chronic, secondary to cumulative UV radiation exposure/sun exposure over time - diffuse scaly erythematous macules with underlying dyspigmentation - Recommend daily broad spectrum sunscreen SPF 30+ to sun-exposed areas, reapply every 2 hours as needed.  - Recommend staying in the shade or wearing long sleeves, sun glasses (UVA+UVB protection) and wide brim hats (4-inch brim around the entire circumference of the hat). - Call for new or changing lesions.   Inflamed seborrheic keratosis (22) neck x 22 Symptomatic, irritating, patient would like treated.  Destruction of lesion - neck x 22 Complexity:  simple   Destruction method: cryotherapy   Informed consent: discussed and consent obtained   Timeout:  patient name, date of birth, surgical site, and procedure verified Lesion destroyed using liquid nitrogen: Yes   Region frozen until ice ball extended beyond lesion: Yes   Outcome: patient tolerated procedure well with no complications   Post-procedure details: wound care instructions given    Return in about 6 months (around 06/25/2022) for TBSE, Hx of BCC, plan field treatment at hx of [place] site R temple.  I, Othelia Pulling, RMA, am acting as scribe for Sarina Ser, MD . Documentation: I have reviewed the above documentation for accuracy and completeness, and I agree with the above.  Sarina Ser, MD

## 2021-12-28 ENCOUNTER — Encounter: Payer: Self-pay | Admitting: Dermatology

## 2022-01-31 ENCOUNTER — Encounter: Payer: Self-pay | Admitting: Obstetrics

## 2022-02-01 ENCOUNTER — Encounter: Payer: Self-pay | Admitting: Obstetrics

## 2022-02-01 ENCOUNTER — Other Ambulatory Visit: Payer: Self-pay | Admitting: Obstetrics

## 2022-02-01 DIAGNOSIS — Z3041 Encounter for surveillance of contraceptive pills: Secondary | ICD-10-CM

## 2022-02-01 MED ORDER — NORETHIN ACE-ETH ESTRAD-FE 1-20 MG-MCG PO TABS: 1.0000 | ORAL_TABLET | Freq: Every day | ORAL | 4 refills | Status: AC

## 2022-02-01 MED ORDER — NORETHIN ACE-ETH ESTRAD-FE 1-20 MG-MCG PO TABS: 1.0000 | ORAL_TABLET | Freq: Every day | ORAL | 11 refills | Status: DC

## 2022-02-01 NOTE — Progress Notes (Signed)
Angela [name] contacted the office as she is no longer breastfeeding, and has had ongoing vaginal bleeding on the [employer]. Switched to [name], low dose , generic OCPs.  [name], CNM  02/01/2022 12:56 PM

## 2022-04-01 DIAGNOSIS — E538 Deficiency of other specified B group vitamins: Secondary | ICD-10-CM | POA: Insufficient documentation

## 2022-04-01 DIAGNOSIS — F419 Anxiety disorder, unspecified: Secondary | ICD-10-CM | POA: Insufficient documentation

## 2022-04-01 DIAGNOSIS — F909 Attention-deficit hyperactivity disorder, unspecified type: Secondary | ICD-10-CM | POA: Insufficient documentation

## 2022-05-28 ENCOUNTER — Other Ambulatory Visit: Payer: Self-pay

## 2022-05-28 MED ORDER — CLOTRIMAZOLE-BETAMETHASONE 1-0.05 % EX CREA: 1.0000 | TOPICAL_CREAM | Freq: Every day | CUTANEOUS | 1 refills | Status: AC | PRN

## 2022-06-29 ENCOUNTER — Ambulatory Visit: Payer: BC Managed Care – PPO | Admitting: Dermatology

## 2022-09-07 ENCOUNTER — Other Ambulatory Visit: Payer: Self-pay

## 2022-09-07 MED ORDER — LISDEXAMFETAMINE DIMESYLATE 20 MG PO CAPS
20.0000 mg | ORAL_CAPSULE | Freq: Every day | ORAL | 0 refills | Status: AC
  Filled 2022-09-07: qty 30, 30d supply, fill #0

## 2022-09-21 ENCOUNTER — Other Ambulatory Visit: Payer: Self-pay

## 2022-09-23 ENCOUNTER — Other Ambulatory Visit: Payer: Self-pay | Admitting: Obstetrics & Gynecology

## 2022-09-23 DIAGNOSIS — Z1231 Encounter for screening mammogram for malignant neoplasm of breast: Secondary | ICD-10-CM

## 2022-11-18 ENCOUNTER — Ambulatory Visit
Admission: RE | Admit: 2022-11-18 | Discharge: 2022-11-18 | Disposition: A | Discharge status: Home / Self Care | Payer: BC Managed Care – PPO | Source: Ambulatory Visit | Attending: Obstetrics & Gynecology | Admitting: Obstetrics & Gynecology

## 2022-11-18 DIAGNOSIS — Z1231 Encounter for screening mammogram for malignant neoplasm of breast: Secondary | ICD-10-CM | POA: Insufficient documentation

## 2022-11-21 IMAGING — MG DIGITAL SCREENING BREAST BILAT IMPLANT W/ TOMO W/ CAD
8 of 12 series · 8 of 28 positions shown · non-contrast
Comparison: Previous exam(s).

CLINICAL DATA: Screening.

EXAM:
DIGITAL SCREENING BILATERAL MAMMOGRAM WITH IMPLANTS, CAD AND
TOMOSYNTHESIS
TECHNIQUE: Bilateral screening digital craniocaudal and mediolateral oblique
mammograms were obtained. Bilateral screening digital breast
tomosynthesis was performed. The images were evaluated with
computer-aided detection. Standard and/or implant displaced views
were performed.

[L MLO]
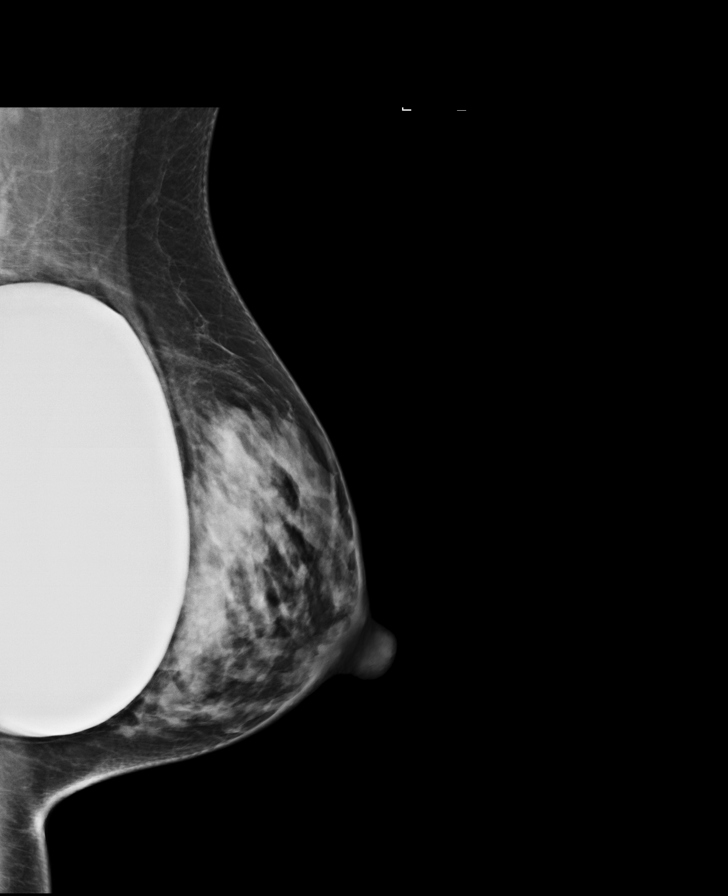

[R MLO]
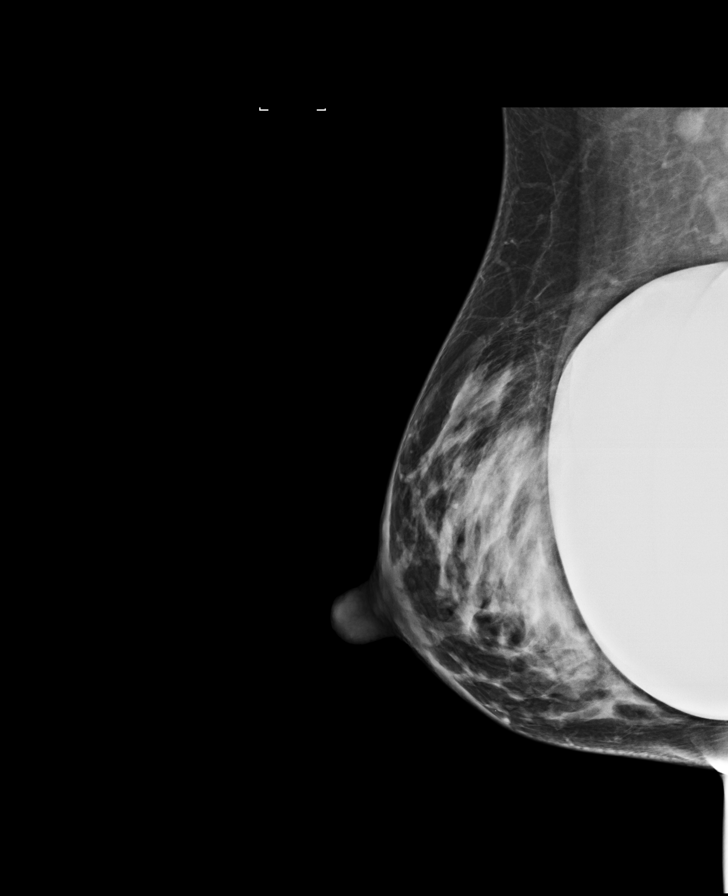

[R CC]
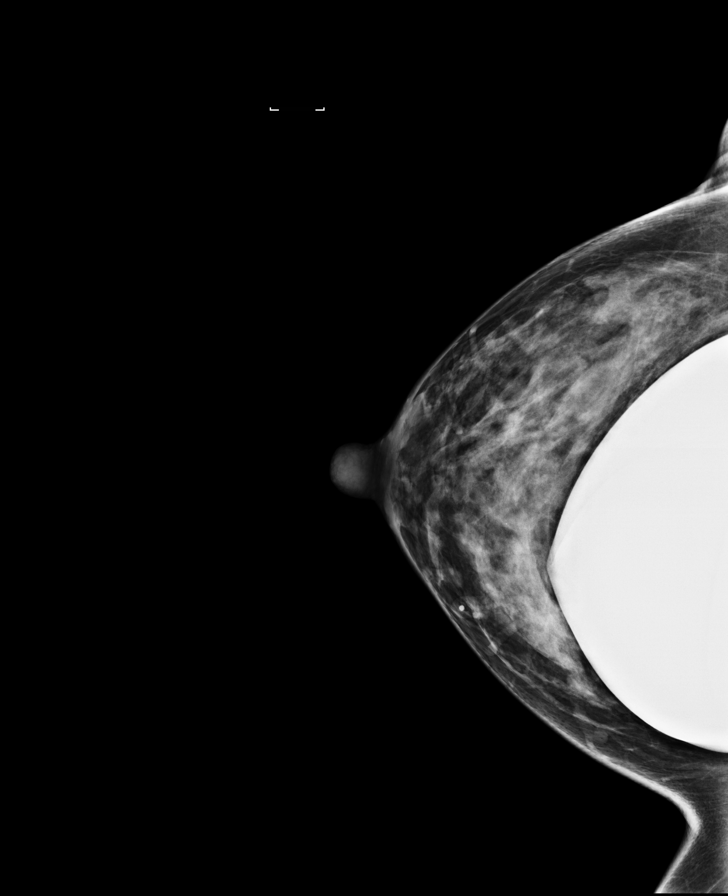

[L CC]
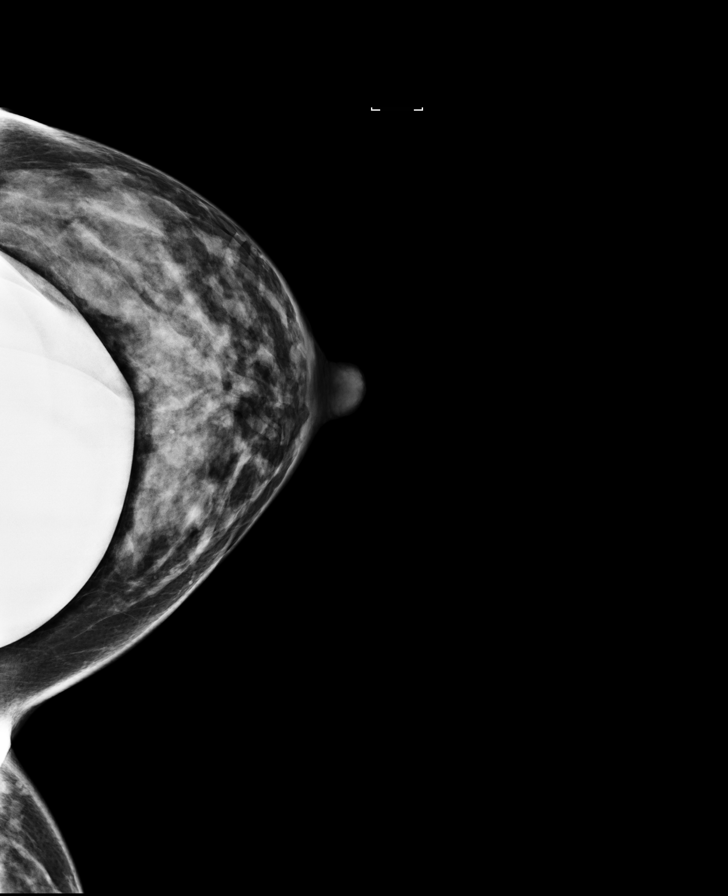

[R CC synth-2D]
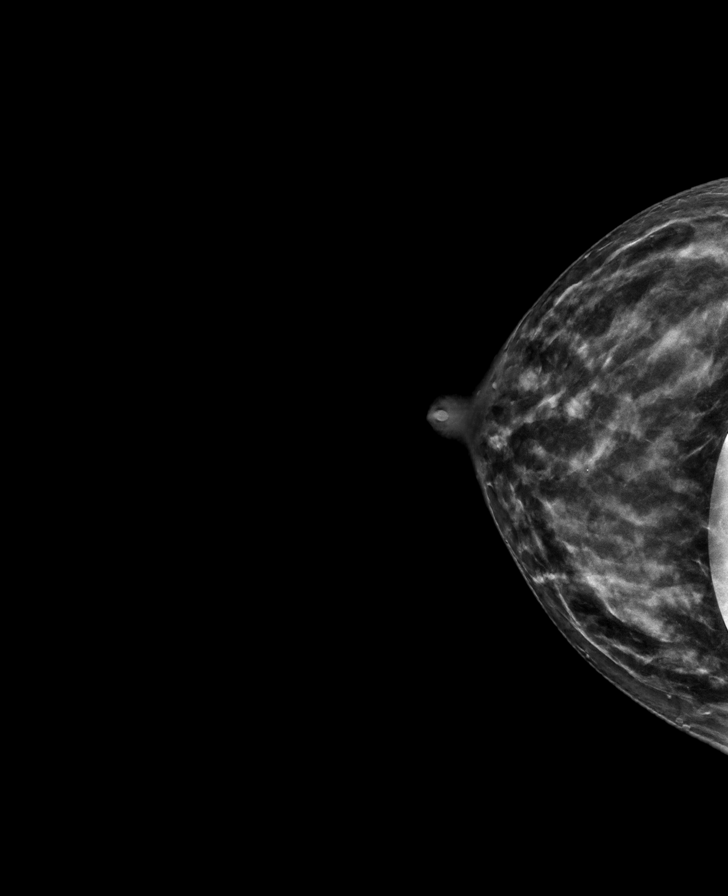

[L CC synth-2D]
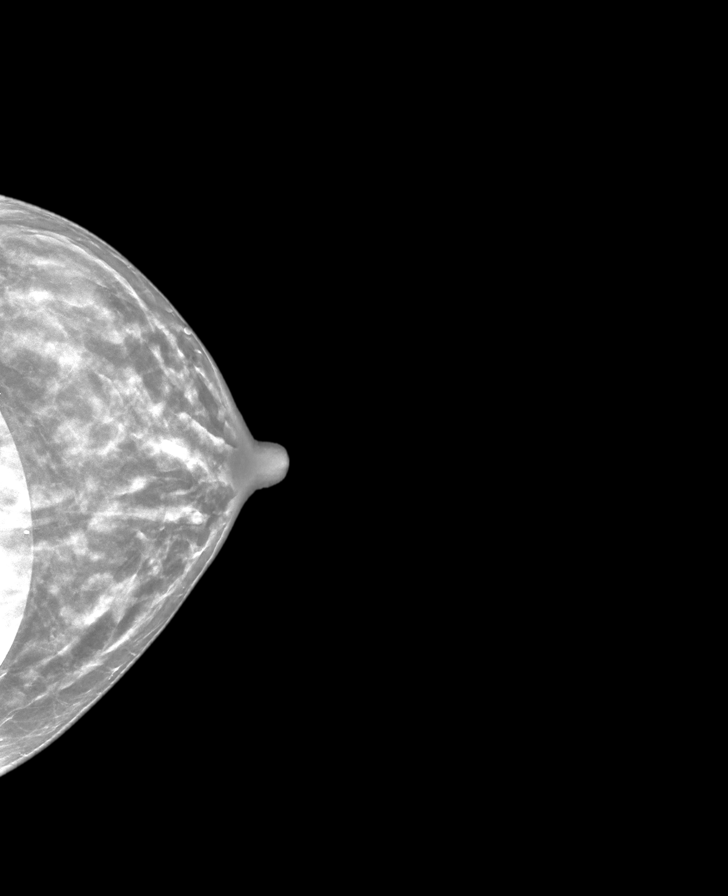

[L MLO synth-2D]
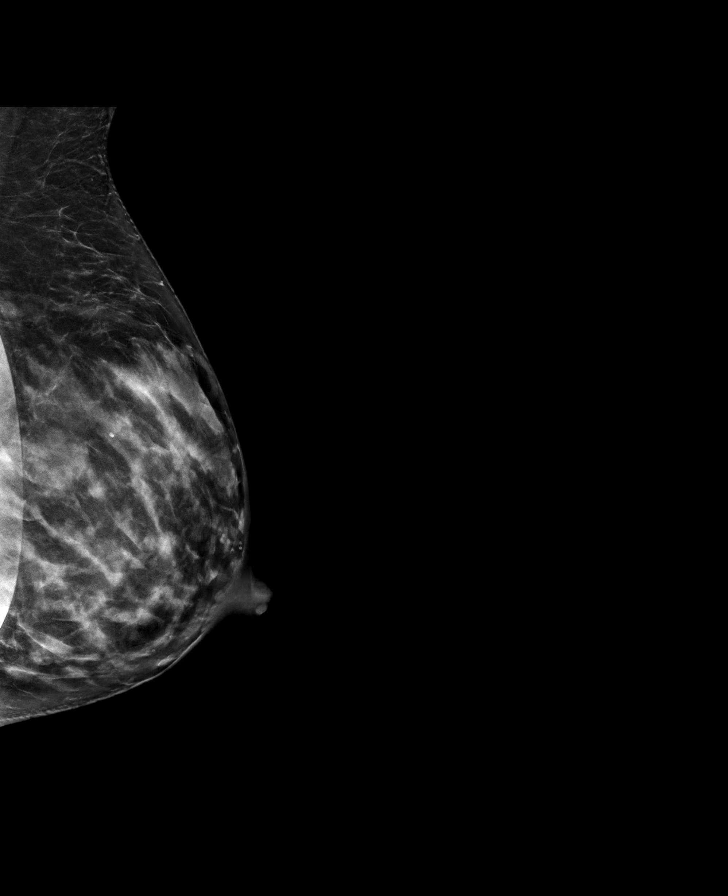

[R MLO synth-2D]
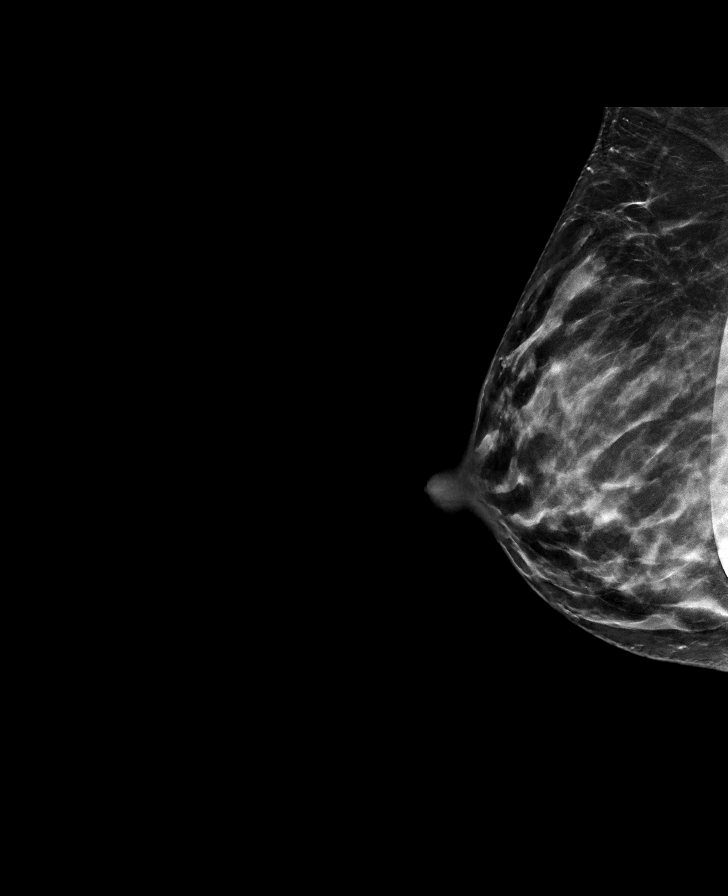

[8 of 28 positions shown; findings below may reference images not displayed]

ACR Breast Density Category c: The breast tissue is heterogeneously
dense, which may obscure small masses.
FINDINGS: The patient has retropectoral implants. There are no findings
suspicious for malignancy.
IMPRESSION: No mammographic evidence of malignancy. A result letter of this
screening mammogram will be mailed directly to the patient.

RECOMMENDATION:
Screening mammogram in one year. (Code:LT-E-7TH)

BI-RADS CATEGORY  1:  Negative.

## 2023-02-13 ENCOUNTER — Ambulatory Visit
Admission: EM | Admit: 2023-02-13 | Discharge: 2023-02-13 | Disposition: A | Discharge status: Home / Self Care | Payer: BC Managed Care – PPO

## 2023-02-13 DIAGNOSIS — S20462A Insect bite (nonvenomous) of left back wall of thorax, initial encounter: Secondary | ICD-10-CM | POA: Diagnosis not present

## 2023-02-13 DIAGNOSIS — W57XXXA Bitten or stung by nonvenomous insect and other nonvenomous arthropods, initial encounter: Secondary | ICD-10-CM

## 2023-02-13 MED ORDER — TRIAMCINOLONE ACETONIDE 0.5 % EX OINT: 1.0000 | TOPICAL_OINTMENT | Freq: Two times a day (BID) | CUTANEOUS | 0 refills | Status: AC

## 2023-02-13 NOTE — ED Triage Notes (Signed)
Pt c/o rash x1week Pt has 3 red spots along the upper right back and pt states that the swelling and redness has gotten larger.  Pt states that she felt something bite her on her back while sitting on the porch.  Pt was unsure if it is a spider bite  Pt had used OTC hydrocortisone cream and states that while taking a shower she felt like she was getting hives.  Pt found another bite along her right hip and used calamine lotion on it.  Pt has taken OTC benadryl  Pt is worried about shingles.

## 2023-02-13 NOTE — Discharge Instructions (Signed)
Make sure to avoid early morning,late evening as insect activity is at peak. Use bug spray when outside, long sleeves/pants, apply triamcinolone cream on insect bites as prescribed,take zyrtec as prescribed. Wash hands frequently, trim nails,avoid scratching. If you develop fever, streaking, worsening issues please follow up immediately with PCP or go to ER.

## 2023-02-13 NOTE — ED Provider Notes (Signed)
Angela [name]    CSN: [id number] Arrival date & time: 02/13/23  0818      History   Chief Complaint Chief Complaint  Patient presents with   Rash    HPI Angela [name] is a 47 y.o. female.   47 year old female pt, Angela [name], presents to urgent care with insect bites to torso x ~1 week. Pt states she was outside and felt something bite her on the porch,has sprayed for spiders/bugs. Pt has used OTC hydrocortisone without relief, also taken Hot shower, and calamine.  The history is provided by the patient. No language interpreter was used.    Past Medical History:  Diagnosis Date   Basal cell carcinoma 01/20/2016   R temple   BCC (basal cell carcinoma of skin) 04/28/2021   R temple - ED&C   Family history of ovarian cancer 03/2021   cancer genetic testing letter sent   H/O birth trauma 09/18/2021   PONV (postoperative nausea and vomiting)     Patient Active Problem List   Diagnosis Date Noted   Insect bites and stings 02/13/2023    Past Surgical History:  Procedure Laterality Date   AUGMENTATION MAMMAPLASTY Bilateral 2013   silicone per pt   CESAREAN SECTION WITH BILATERAL TUBAL LIGATION Bilateral 09/18/2021   Procedure: CESAREAN SECTION WITH BILATERAL TUBAL LIGATION RNFA;  Surgeon: Nadara Mustard, MD;  Location: [facility] ORS;  Service: Obstetrics;  Laterality: Bilateral;    OB History     Gravida  4   Para  2   Term  2   Preterm      AB  2   Living  2      SAB  2   IAB      Ectopic      Multiple  0   Live Births  1            Home Medications    Prior to Admission medications   Medication Sig Start Date End Date Taking? Authorizing Provider  amphetamine-dextroamphetamine (ADDERALL XR) 15 MG 24 hr capsule Take by mouth. 02/10/23 02/10/24 Yes [provider]  buPROPion ER (WELLBUTRIN SR) 100 MG 12 hr tablet Take by mouth. 02/10/23  Yes [provider]  metoCLOPramide (REGLAN) 10 MG tablet Take 1 tablet  (10 mg total) by mouth 4 (four) times daily as needed (breast milk supply). 10/29/21   Nadara Mustard, MD  norethindrone (MICRONOR) 0.35 MG tablet Take 1 tablet by mouth daily.   Yes [provider]  triamcinolone ointment (KENALOG) 0.5 % Apply 1 Application topically 2 (two) times daily. To affected areas,avoid face 02/13/23  Yes Deepti Gunawan, Para March, NP  clotrimazole-betamethasone (LOTRISONE) cream Apply 1 Application topically daily as needed (irritation). 05/28/22   Glenetta Borg, CNM  lisdexamfetamine (VYVANSE) 20 MG capsule Take 1 capsule (20 mg total) by mouth daily. 09/07/22     mometasone (ELOCON) 0.1 % cream Apply 1 application. topically daily as needed (Rash). 10/28/21   Deirdre Evener, MD  norethindrone-ethinyl estradiol-FE (JUNEL FE 1/20) 1-20 MG-MCG tablet Take 1 tablet by mouth daily. 02/01/22   Mirna Mires, CNM  Prenatal Vit-Fe Fumarate-FA ([employer] PRENATAL) 28-0.8 MG TABS Take 1 tablet by mouth daily. Patient not taking: Reported on 10/26/2021 12/21/16   [provider]    Family History Family History  Problem Relation Age of Onset   Ovarian cancer Paternal Grandmother 46   Breast cancer Neg Hx     Social History Social History  Tobacco Use   Smoking status: Never   Smokeless tobacco: Never  Vaping Use   Vaping status: Never Used  Substance Use Topics   Alcohol use: Not Currently   Drug use: Never     Allergies   Patient has no known allergies.   Review of Systems Review of Systems  Skin:  Positive for color change and wound.  All other systems reviewed and are negative.    Physical Exam Triage Vital Signs ED Triage Vitals  Encounter Vitals Group     BP      Systolic BP Percentile      Diastolic BP Percentile      Pulse      Resp      Temp      Temp src      SpO2      Weight      Height      Head Circumference      Peak Flow      Pain Score      Pain Loc      Pain Education      Exclude from Growth Chart    No data  found.  Updated Vital Signs BP 119/77 (BP Location: Left Arm)   Pulse 84   Temp (!) 97.5 F (36.4 C) (Temporal)   Resp 16   Ht 5\' 3"  (1.6 m)   Wt 162 lb (73.5 kg)   LMP 02/13/2023   SpO2 97%   BMI 28.70 kg/m   Visual Acuity Right Eye Distance:   Left Eye Distance:   Bilateral Distance:    Right Eye Near:   Left Eye Near:    Bilateral Near:     Physical Exam Vitals and nursing note reviewed.  Skin:    General: Skin is warm.     Capillary Refill: Capillary refill takes less than 2 seconds.     Findings: Erythema and wound present.     Comments: 4 insect bites noted to torso 3 on right posterior upper torso and 1 left shoulder, no vesicles,no pustules, no drainage or streaking noted.  Neurological:     General: No focal deficit present.     Mental Status: She is alert and oriented to person, place, and time.     GCS: GCS eye subscore is 4. GCS verbal subscore is 5. GCS motor subscore is 6.  Psychiatric:        Attention and Perception: Attention normal.        Mood and Affect: Mood normal.        Speech: Speech normal.        Behavior: Behavior normal.      UC Treatments / Results  Labs (all labs ordered are listed, but only abnormal results are displayed) Labs Reviewed - No data to display  EKG   Radiology No results found.  Procedures Procedures (including critical care time)  Medications Ordered in UC Medications - No data to display  Initial Impression / Assessment and Plan / UC Course  I have reviewed the triage vital signs and the nursing notes.  Pertinent labs & imaging results that were available during my care of the patient were reviewed by me and considered in my medical decision making (see chart for details).     Ddx: Insect bite,sting,rash, shingles Final Clinical Impressions(s) / UC Diagnoses   Final diagnoses:  Insect bites and stings, initial encounter     Discharge Instructions      Make sure to avoid  early morning,late  evening as insect activity is at peak. Use bug spray when outside, long sleeves/pants, apply triamcinolone cream on insect bites as prescribed,take zyrtec as prescribed. Wash hands frequently, trim nails,avoid scratching. If you develop fever, streaking, worsening issues please follow up immediately with PCP or go to ER.      ED Prescriptions     Medication Sig Dispense Auth. Provider   triamcinolone ointment (KENALOG) 0.5 % Apply 1 Application topically 2 (two) times daily. To affected areas,avoid face 15 g Kazuki Ingle, Para March, NP      PDMP not reviewed this encounter.   Clancy Gourd, NP 02/13/23 [phone]

## 2023-04-01 DIAGNOSIS — E6609 Other obesity due to excess calories: Secondary | ICD-10-CM | POA: Insufficient documentation

## 2023-10-11 ENCOUNTER — Telehealth: Payer: Self-pay

## 2023-10-11 ENCOUNTER — Other Ambulatory Visit: Payer: Self-pay

## 2023-10-11 DIAGNOSIS — Z1211 Encounter for screening for malignant neoplasm of colon: Secondary | ICD-10-CM

## 2023-10-11 MED ORDER — NA SULFATE-K SULFATE-MG SULF 17.5-3.13-1.6 GM/177ML PO SOLN: 1.0000 | Freq: Once | ORAL | 0 refills | Status: AC

## 2023-10-11 NOTE — Telephone Encounter (Signed)
 Gastroenterology Pre-Procedure Review  Request Date: 10/27/23 Requesting Physician: Dr. Servando Snare  PATIENT REVIEW QUESTIONS: The patient responded to the following health history questions as indicated:    1. Are you having any GI issues? no 2. Do you have a personal history of Polyps? no 3. Do you have a family history of Colon Cancer or Polyps? no 4. Diabetes Mellitus? no 5. Joint replacements in the past 12 months?no 6. Major health problems in the past 3 months?no 7. Any artificial heart valves, MVP, or defibrillator?no    MEDICATIONS & ALLERGIES:    Patient reports the following regarding taking any anticoagulation/antiplatelet therapy:   Plavix, Coumadin, Eliquis, Xarelto, Lovenox, Pradaxa, Brilinta, or Effient? no Aspirin? no  Patient confirms/reports the following medications:  Current Outpatient Medications  Medication Sig Dispense Refill   metoCLOPramide (REGLAN) 10 MG tablet Take 1 tablet (10 mg total) by mouth 4 (four) times daily as needed (breast milk supply). 120 tablet 5   amphetamine-dextroamphetamine (ADDERALL XR) 15 MG 24 hr capsule Take by mouth.     buPROPion ER (WELLBUTRIN SR) 100 MG 12 hr tablet Take by mouth.     clotrimazole-betamethasone (LOTRISONE) cream Apply 1 Application topically daily as needed (irritation). 30 g 1   lisdexamfetamine (VYVANSE) 20 MG capsule Take 1 capsule (20 mg total) by mouth daily. 30 capsule 0   mometasone (ELOCON) 0.1 % cream Apply 1 application. topically daily as needed (Rash). 45 g 2   norethindrone (MICRONOR) 0.35 MG tablet Take 1 tablet by mouth daily.     norethindrone-ethinyl estradiol-FE (JUNEL FE 1/20) 1-20 MG-MCG tablet Take 1 tablet by mouth daily. 84 tablet 4   Prenatal Vit-Fe Fumarate-FA ([employer] PRENATAL) 28-0.8 MG TABS Take 1 tablet by mouth daily. (Patient not taking: Reported on 10/26/2021)     triamcinolone ointment (KENALOG) 0.5 % Apply 1 Application topically 2 (two) times daily. To affected areas,avoid face 15 g 0   No  current facility-administered medications for this visit.    Patient confirms/reports the following allergies:  No Known Allergies  No orders of the defined types were placed in this encounter.   AUTHORIZATION INFORMATION Primary Insurance: 1D#: Group #:  Secondary Insurance: 1D#: Group #:  SCHEDULE INFORMATION: Date: 10/27/23 Time: Location: [facility]

## 2023-10-12 ENCOUNTER — Telehealth: Payer: Self-pay

## 2023-10-12 NOTE — Telephone Encounter (Signed)
 Voice message has been left for patient to call me back in regards to rescheduling her colonoscopy from 10/31/23 due to Dr. Servando Snare was already full on this day.  Will send her a mychart message as well.  Thanks, [place], [place]

## 2023-10-14 ENCOUNTER — Other Ambulatory Visit: Payer: Self-pay | Admitting: Obstetrics & Gynecology

## 2023-10-14 DIAGNOSIS — Z1231 Encounter for screening mammogram for malignant neoplasm of breast: Secondary | ICD-10-CM

## 2023-10-18 ENCOUNTER — Telehealth: Payer: Self-pay

## 2023-10-18 DIAGNOSIS — Z1211 Encounter for screening for malignant neoplasm of colon: Secondary | ICD-10-CM

## 2023-10-18 NOTE — Telephone Encounter (Signed)
 Contacted patient to let her know that I will need to reschedule her procedure.  She was originally on 04/03 according to her instructions but that date is full. The scheduled date in epic was showing 04/07.    She is currently in a meeting and has asked to call me back.  Both dates 04/03 and /04/07 are full so she will still need to be rescheduled.  Thanks, [place], [place]

## 2023-10-18 NOTE — Telephone Encounter (Signed)
 Call has been returned to reschedule patients colonoscopy.  Colonoscopy has been rescheduled to 01/05/24 with Dr. Servando Snare. Baxter Hire in endo notified.  Referral updated.  Instructions updated.  Thanks, [place], [place]

## 2023-10-18 NOTE — Telephone Encounter (Signed)
 Pt lmovm returning your call regarding rescheduling her procedure

## 2023-12-02 ENCOUNTER — Ambulatory Visit
Admission: RE | Admit: 2023-12-02 | Discharge: 2023-12-02 | Disposition: A | Discharge status: Home / Self Care | Source: Ambulatory Visit | Attending: Obstetrics & Gynecology | Admitting: Obstetrics & Gynecology

## 2023-12-02 DIAGNOSIS — Z1231 Encounter for screening mammogram for malignant neoplasm of breast: Secondary | ICD-10-CM | POA: Diagnosis present

## 2023-12-13 ENCOUNTER — Telehealth: Payer: Self-pay

## 2023-12-13 NOTE — Telephone Encounter (Signed)
 The patient called in to reschedule her procedure from lack of transportation.

## 2023-12-13 NOTE — Telephone Encounter (Signed)
 Patient has requested to reschedule her colonoscopy due to her husband will be out of town.  Colonoscopy has been rescheduled to 01/19/24 with Dr. Ole Berkeley.  Leslie in Endo notified.  Instructions updated.  Referral updated.  Thanks,  [place], CMA

## 2024-01-19 ENCOUNTER — Other Ambulatory Visit: Payer: Self-pay

## 2024-01-19 ENCOUNTER — Encounter
Admission: RE | Disposition: A | Discharge status: Home / Self Care | Payer: Self-pay | Source: Home / Self Care | Attending: Gastroenterology

## 2024-01-19 ENCOUNTER — Encounter: Payer: Self-pay | Admitting: Gastroenterology

## 2024-01-19 ENCOUNTER — Ambulatory Visit

## 2024-01-19 ENCOUNTER — Ambulatory Visit
Admission: RE | Admit: 2024-01-19 | Discharge: 2024-01-19 | Disposition: A | Discharge status: Home / Self Care | Attending: Gastroenterology | Admitting: Gastroenterology

## 2024-01-19 DIAGNOSIS — Z1211 Encounter for screening for malignant neoplasm of colon: Secondary | ICD-10-CM | POA: Diagnosis present

## 2024-01-19 DIAGNOSIS — F419 Anxiety disorder, unspecified: Secondary | ICD-10-CM | POA: Insufficient documentation

## 2024-01-19 DIAGNOSIS — K5730 Diverticulosis of large intestine without perforation or abscess without bleeding: Secondary | ICD-10-CM | POA: Insufficient documentation

## 2024-01-19 HISTORY — PX: COLONOSCOPY: SHX5424

## 2024-01-19 LAB — POCT PREGNANCY, URINE: Preg Test, Ur: NEGATIVE

## 2024-01-19 SURGERY — COLONOSCOPY
Anesthesia: General

## 2024-01-19 MED ORDER — SODIUM CHLORIDE 0.9 % IV SOLN: INTRAVENOUS | Status: DC

## 2024-01-19 MED ORDER — PHENYLEPHRINE 80 MCG/ML (10ML) SYRINGE FOR IV PUSH (FOR BLOOD PRESSURE SUPPORT)
PREFILLED_SYRINGE | INTRAVENOUS | Status: DC | PRN
  Administered 2024-01-19 (×2): 160 ug via INTRAVENOUS
  Administered 2024-01-19: 80 ug via INTRAVENOUS

## 2024-01-19 MED ORDER — LIDOCAINE HCL (CARDIAC) PF 100 MG/5ML IV SOSY
PREFILLED_SYRINGE | INTRAVENOUS | Status: DC | PRN
  Administered 2024-01-19: 40 mg via INTRAVENOUS

## 2024-01-19 MED ORDER — PROPOFOL 500 MG/50ML IV EMUL
INTRAVENOUS | Status: DC | PRN
  Administered 2024-01-19: 100 ug/kg/min via INTRAVENOUS

## 2024-01-19 MED ORDER — PROPOFOL 10 MG/ML IV BOLUS
INTRAVENOUS | Status: DC | PRN
Start: 2024-01-19 — End: 2024-01-19
  Administered 2024-01-19: 100 mg via INTRAVENOUS
  Administered 2024-01-19: 50 mg via INTRAVENOUS

## 2024-01-19 MED ORDER — PROPOFOL 1000 MG/100ML IV EMUL
INTRAVENOUS | Status: AC
  Filled 2024-01-19: qty 100

## 2024-01-19 NOTE — Anesthesia Postprocedure Evaluation (Signed)
 Anesthesia Post Note  Patient: Angela [name]  Procedure(s) Performed: COLONOSCOPY  Patient location during evaluation: PACU Anesthesia Type: General Level of consciousness: awake Pain management: satisfactory to patient Vital Signs Assessment: post-procedure vital signs reviewed and stable Respiratory status: spontaneous breathing Cardiovascular status: stable Anesthetic complications: no   No notable events documented.   Last Vitals:  Vitals:   01/19/24 0858 01/19/24 0906  BP: (!) 89/57 (!) 92/59  Pulse:    Resp: 15 16  Temp:    SpO2:      Last Pain:  Vitals:   01/19/24 0848  TempSrc: Oral  PainSc:                  VAN STAVEREN,Hamzah [place]

## 2024-01-19 NOTE — Op Note (Signed)
 [facility] Patient Name: Angela [name] Procedure Date: 01/19/2024 8:12 AM MRN: [id number] Account #: [id number] Date of Birth: 09/24/1975 Admit Type: Outpatient Age: 48 Room: [facility] ENDO ROOM 4 Gender: Female Note Status: Finalized Instrument Name: Arvis [id number] Procedure:             Colonoscopy Indications:           Screening for colorectal malignant neoplasm Providers:             Rogelia Copping MD, MD Referring MD:          Harriette Less (Referring MD) Medicines:             Propofol per Anesthesia Complications:         No immediate complications. Procedure:             Pre-Anesthesia Assessment:                        - Prior to the procedure, a History and Physical was                         performed, and patient medications and allergies were                         reviewed. The patient's tolerance of previous                         anesthesia was also reviewed. The risks and benefits                         of the procedure and the sedation options and risks                         were discussed with the patient. All questions were                         answered, and informed consent was obtained. Prior                         Anticoagulants: The patient has taken no anticoagulant                         or antiplatelet agents. ASA Grade Assessment: II - A                         patient with mild systemic disease. After reviewing                         the risks and benefits, the patient was deemed in                         satisfactory condition to undergo the procedure.                        After obtaining informed consent, the colonoscope was                         passed under direct vision. Throughout the procedure,  the patient's blood pressure, pulse, and oxygen                         saturations were monitored continuously. The                         Colonoscope was introduced through  the anus and                         advanced to the the cecum, identified by appendiceal                         orifice and ileocecal valve. The colonoscopy was                         performed without difficulty. The patient tolerated                         the procedure well. The quality of the bowel                         preparation was excellent. Findings:      The perianal and digital rectal examinations were normal.      A few small-mouthed diverticula were found in the entire colon. Impression:            - Diverticulosis in the entire examined colon.                        - No specimens collected. Recommendation:        - Discharge patient to home.                        - Resume previous diet.                        - Continue present medications.                        - Repeat colonoscopy in 10 years for screening                         purposes. Procedure Code(s):     --- Professional ---                        [phone], Colonoscopy, flexible; diagnostic, including                         collection of specimen(s) by brushing or washing, when                         performed (separate procedure) Diagnosis Code(s):     --- Professional ---                        Z12.11, Encounter for screening for malignant neoplasm                         of colon CPT copyright 2022 [employer]. All rights reserved. The codes documented in this report are preliminary and upon coder review  may  be revised to meet current compliance requirements. Rogelia Copping MD, MD 01/19/2024 8:45:49 AM This report has been signed electronically. Number of Addenda: 0 Note Initiated On: 01/19/2024 8:12 AM Scope Withdrawal Time: 0 hours 7 minutes 7 seconds  Total Procedure Duration: 0 hours 11 minutes 9 seconds  Estimated Blood Loss:  Estimated blood loss: none. Estimated blood loss: none.      [facility]

## 2024-01-19 NOTE — H&P (Signed)
 Angela Copping, MD [facility] [address], [address] [place], [place] [address] Phone: [phone] Fax : [phone]  Primary Care Physician:  Avelina Sheldon, MD Primary Gastroenterologist:  Dr. Copping  Pre-Procedure History & Physical: HPI:  Angela [name] is a 48 y.o. female is here for a screening colonoscopy.   Past Medical History:  Diagnosis Date   Basal cell carcinoma 01/20/2016   R temple   BCC (basal cell carcinoma of skin) 04/28/2021   [facility] - ED&C   Family history of ovarian cancer 03/2021   cancer genetic testing letter sent   H/O birth trauma 09/18/2021   PONV (postoperative nausea and vomiting)     Past Surgical History:  Procedure Laterality Date   AUGMENTATION MAMMAPLASTY Bilateral 2013   silicone per pt   CESAREAN SECTION WITH BILATERAL TUBAL LIGATION Bilateral 09/18/2021   Procedure: CESAREAN SECTION WITH BILATERAL TUBAL LIGATION RNFA;  Surgeon: Arloa Lamar SQUIBB, MD;  Location: [facility] ORS;  Service: Obstetrics;  Laterality: Bilateral;    Prior to Admission medications   Medication Sig Start Date End Date Taking? Authorizing Provider  amphetamine-dextroamphetamine (ADDERALL XR) 15 MG 24 hr capsule Take by mouth. 02/10/23 02/10/24  [provider]  buPROPion ER (WELLBUTRIN SR) 100 MG 12 hr tablet Take by mouth. 02/10/23   [provider]  clotrimazole -betamethasone  (LOTRISONE ) cream Apply 1 Application topically daily as needed (irritation). 05/28/22   Swanson, Melissa M, CNM  lisdexamfetamine (VYVANSE ) 20 MG capsule Take 1 capsule (20 mg total) by mouth daily. 09/07/22     metoCLOPramide  (REGLAN ) 10 MG tablet Take 1 tablet (10 mg total) by mouth 4 (four) times daily as needed (breast milk supply). 10/29/21   Arloa Lamar SQUIBB, MD  mometasone  (ELOCON ) 0.1 % cream Apply 1 application. topically daily as needed (Rash). 10/28/21   Hester Alm BROCKS, MD  norethindrone  (MICRONOR ) 0.35 MG tablet Take 1 tablet by mouth daily.    [provider]   norethindrone -ethinyl estradiol-FE (JUNEL FE 1/20) 1-20 MG-MCG tablet Take 1 tablet by mouth daily. 02/01/22   Carlin Rollene HERO, CNM  Prenatal Vit-Fe Fumarate-FA ([employer] PRENATAL) 28-0.8 MG TABS Take 1 tablet by mouth daily. 12/21/16   [provider]  triamcinolone  ointment (KENALOG ) 0.5 % Apply 1 Application topically 2 (two) times daily. To affected areas,avoid face 02/13/23   Defelice, Rilla, NP    Allergies as of 10/11/2023   (No Known Allergies)    Family History  Problem Relation Age of Onset   Ovarian cancer Paternal Grandmother 20   Breast cancer Neg Hx     Social History   Socioeconomic History   Marital status: Married    Spouse name: Lorrene   Number of children: 1   Years of education: Not on file   Highest education level: Not on file  Occupational History   Not on file  Tobacco Use   Smoking status: Never   Smokeless tobacco: Never  Vaping Use   Vaping status: Never Used  Substance and Sexual Activity   Alcohol use: Not Currently   Drug use: Never   Sexual activity: Not Currently  Other Topics Concern   Not on file  Social History Narrative   Not on file   Social Drivers of Health   Financial Resource Strain: Not on file  Food Insecurity: No Food Insecurity (02/10/2023)   Received from [facility]   Hunger Vital Sign    Within the past 12 months, you worried that your food would run out before  you got the money to buy more.: Never true    Within the past 12 months, the food you bought just didn't last and you didn't have money to get more.: Never true  Transportation Needs: No Transportation Needs (02/10/2023)   Received from [facility]   PRAPARE - Transportation    Lack of Transportation (Medical): No    Lack of Transportation (Non-Medical): No  Physical Activity: Not on file  Stress: Not on file  Social Connections: Not on file  Intimate Partner Violence: Not on file    Review of Systems: See HPI, otherwise negative  ROS  Physical Exam: BP (!) 92/58   Pulse 72   Temp (!) 96.9 F (36.1 C) (Temporal)   Resp 16   Ht 5' 2.5 (1.588 m)   Wt 61.7 kg   SpO2 100%   BMI 24.48 kg/m  General:   Alert,  pleasant and cooperative in NAD Head:  Normocephalic and atraumatic. Neck:  Supple; no masses or thyromegaly. Lungs:  Clear throughout to auscultation.    Heart:  Regular rate and rhythm. Abdomen:  Soft, nontender and nondistended. Normal bowel sounds, without guarding, and without rebound.   Neurologic:  Alert and  oriented x4;  grossly normal neurologically.  Impression/Plan: [name] is now here to undergo a screening colonoscopy.  Risks, benefits, and alternatives regarding colonoscopy have been reviewed with the patient.  Questions have been answered.  All parties agreeable.

## 2024-01-19 NOTE — Transfer of Care (Signed)
 Immediate Anesthesia Transfer of Care Note  Patient: Angela [name]  Procedure(s) Performed: Procedure(s): COLONOSCOPY (N/A)  Patient Location: PACU and Endoscopy Unit  Anesthesia Type:General  Level of Consciousness: sedated  Airway & Oxygen Therapy: Patient Spontanous Breathing and Patient connected to nasal cannula oxygen  Post-op Assessment: Report given to RN and Post -op Vital signs reviewed and stable  Post vital signs: Reviewed and stable  Last Vitals:  Vitals:   01/19/24 0848 01/19/24 0850  BP: (!) 63/36 (!) 77/47  Pulse: 63 63  Resp: (!) 22 17  Temp: (!) 35.4 C   SpO2: 99% 100%    Complications: No apparent anesthesia complications

## 2024-01-19 NOTE — Anesthesia Preprocedure Evaluation (Signed)
 Anesthesia Evaluation  Patient identified by MRN, date of birth, ID band Patient awake    Reviewed: Allergy & Precautions, NPO status , Patient's Chart, lab work & pertinent test results, reviewed documented beta blocker date and time   History of Anesthesia Complications (+) PONV and history of anesthetic complications  Airway Mallampati: II  TM Distance: >3 FB Neck ROM: full    Dental  (+) Teeth Intact   Pulmonary neg pulmonary ROS   Pulmonary exam normal breath sounds clear to auscultation       Cardiovascular Exercise Tolerance: Good negative cardio ROS Normal cardiovascular exam Rhythm:Regular Rate:Normal     Neuro/Psych   Anxiety     negative neurological ROS  negative psych ROS   GI/Hepatic negative GI ROS, Neg liver ROS,,,  Endo/Other  negative endocrine ROS    Renal/GU negative Renal ROS  negative genitourinary   Musculoskeletal   Abdominal Normal abdominal exam  (+)   Peds negative pediatric ROS (+)  Hematology negative hematology ROS (+)   Anesthesia Other Findings Past Medical History: 01/20/2016: Basal cell carcinoma     Comment:  R [facility] 04/28/2021: BCC (basal cell carcinoma of skin)     Comment:  R [facility] - ED&C 03/2021: Family history of ovarian cancer     Comment:  cancer genetic testing letter sent 09/18/2021: H/O birth trauma No date: PONV (postoperative nausea and vomiting)  Past Surgical History: 2013: AUGMENTATION MAMMAPLASTY; Bilateral     Comment:  silicone per pt 09/18/2021: CESAREAN SECTION WITH BILATERAL TUBAL LIGATION; Bilateral     Comment:  Procedure: CESAREAN SECTION WITH BILATERAL TUBAL               LIGATION RNFA;  Surgeon: Arloa Lamar SQUIBB, MD;  Location:              [facility] ORS;  Service: Obstetrics;  Laterality: Bilateral;  BMI    Body Mass Index: 24.48 kg/m      Reproductive/Obstetrics negative OB ROS                             Anesthesia  Physical Anesthesia Plan  ASA: 1  Anesthesia Plan: General   Post-op Pain Management:    Induction: Intravenous  PONV Risk Score and Plan: Propofol infusion and TIVA  Airway Management Planned: Natural Airway and Nasal Cannula  Additional Equipment:   Intra-op Plan:   Post-operative Plan:   Informed Consent: I have reviewed the patients History and Physical, chart, labs and discussed the procedure including the risks, benefits and alternatives for the proposed anesthesia with the patient or authorized representative who has indicated his/her understanding and acceptance.     Dental Advisory Given  Plan Discussed with: Anesthesiologist, CRNA and Surgeon  Anesthesia Plan Comments:        Anesthesia Quick Evaluation

## 2024-03-02 ENCOUNTER — Ambulatory Visit: Admission: EM | Admit: 2024-03-02 | Discharge: 2024-03-02 | Disposition: A | Discharge status: Home / Self Care

## 2024-03-02 ENCOUNTER — Ambulatory Visit (INDEPENDENT_AMBULATORY_CARE_PROVIDER_SITE_OTHER)

## 2024-03-02 DIAGNOSIS — R058 Other specified cough: Secondary | ICD-10-CM | POA: Diagnosis not present

## 2024-03-02 DIAGNOSIS — J069 Acute upper respiratory infection, unspecified: Secondary | ICD-10-CM

## 2024-03-02 DIAGNOSIS — R062 Wheezing: Secondary | ICD-10-CM

## 2024-03-02 MED ORDER — AZITHROMYCIN 250 MG PO TABS: 250.0000 mg | ORAL_TABLET | Freq: Every day | ORAL | 0 refills | Status: AC

## 2024-03-02 MED ORDER — PREDNISONE 10 MG (21) PO TBPK: ORAL_TABLET | Freq: Every day | ORAL | 0 refills | Status: AC

## 2024-03-02 MED ORDER — ALBUTEROL SULFATE HFA 108 (90 BASE) MCG/ACT IN AERS
2.0000 | INHALATION_SPRAY | Freq: Once | RESPIRATORY_TRACT | Status: AC
  Administered 2024-03-02: 2 via RESPIRATORY_TRACT

## 2024-03-02 NOTE — ED Triage Notes (Signed)
 Patient to Urgent Care with complaints of productive cough/ wheezing/ chest congestion. Unsure of any fevers- has been hot flashes at night.   Symptoms started on Tuesday.   Taking Mucinex.

## 2024-03-02 NOTE — ED Provider Notes (Signed)
 [facility]    CSN: [id number] Arrival date & time: 03/02/24  9075      History   Chief Complaint Chief Complaint  Patient presents with   Cough   Wheezing    HPI Angela [name] is a 48 y.o. female.  Patient presents with 2-day history of congestion, cough, wheezing.  Her cough is productive.  No fever or shortness of breath.  She has been treating her symptoms with Mucinex DM.  She reports no history of lung disease.  She does not smoke or vape.  The history is provided by the patient and medical records.    Past Medical History:  Diagnosis Date   Basal cell carcinoma 01/20/2016   R temple   BCC (basal cell carcinoma of skin) 04/28/2021   R [facility] - ED&C   Family history of ovarian cancer 03/2021   cancer genetic testing letter sent   H/O birth trauma 09/18/2021   PONV (postoperative nausea and vomiting)     Patient Active Problem List   Diagnosis Date Noted   Encounter for screening colonoscopy 01/19/2024   Class 1 obesity due to excess calories without serious comorbidity with body mass index (BMI) of 30.0 to 30.9 in adult 04/01/2023   Insect bites and stings 02/13/2023   Adult ADHD 04/01/2022   Anxiety 04/01/2022   B12 deficiency 04/01/2022   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   Excessive and frequent menstruation with regular cycle 02/21/2014   Health care maintenance 02/21/2014   Normal vaginal delivery 07/13/2010    Past Surgical History:  Procedure Laterality Date   AUGMENTATION MAMMAPLASTY Bilateral 2013   silicone per pt   CESAREAN SECTION WITH BILATERAL TUBAL LIGATION Bilateral 09/18/2021   Procedure: CESAREAN SECTION WITH BILATERAL TUBAL LIGATION RNFA;  Surgeon: Arloa Lamar SQUIBB, MD;  Location: [facility] ORS;  Service: Obstetrics;  Laterality: Bilateral;   COLONOSCOPY N/A 01/19/2024   Procedure: COLONOSCOPY;  Surgeon: Jinny Carmine, MD;  Location: [facility] ENDOSCOPY;  Service: Endoscopy;  Laterality: N/A;    OB History     Gravida  4    Para  2   Term  2   Preterm      AB  2   Living  2      SAB  2   IAB      Ectopic      Multiple  0   Live Births  1            Home Medications    Prior to Admission medications   Medication Sig Start Date End Date Taking? Authorizing Provider  azithromycin  (ZITHROMAX ) 250 MG tablet Take 1 tablet (250 mg total) by mouth daily. Take first 2 tablets together, then 1 every day until finished. 03/02/24  Yes Corlis Burnard DEL, NP  predniSONE  (STERAPRED UNI-PAK 21 TAB) 10 MG (21) TBPK tablet Take by mouth daily. As directed 03/02/24  Yes Corlis Burnard DEL, NP  amphetamine-dextroamphetamine (ADDERALL XR) 15 MG 24 hr capsule Take by mouth. Patient not taking: Reported on 03/02/2024 02/10/23 02/10/24  [provider]  buPROPion ER (WELLBUTRIN SR) 100 MG 12 hr tablet Take by mouth. Patient not taking: Reported on 03/02/2024 02/10/23   [provider]  clotrimazole -betamethasone  (LOTRISONE ) cream Apply 1 Application topically daily as needed (irritation). 05/28/22   Swanson, Melissa M, CNM  lisdexamfetamine (VYVANSE ) 20 MG capsule Take 1 capsule (20 mg total) by mouth daily. Patient not taking: Reported on 03/02/2024 09/07/22     metoCLOPramide  (REGLAN ) 10 MG  tablet Take 1 tablet (10 mg total) by mouth 4 (four) times daily as needed (breast milk supply). Patient not taking: Reported on 03/02/2024 10/29/21   Arloa Lamar SQUIBB, MD  mometasone  (ELOCON ) 0.1 % cream Apply 1 application. topically daily as needed (Rash). 10/28/21   Hester Alm BROCKS, MD  norethindrone  (MICRONOR ) 0.35 MG tablet Take 1 tablet by mouth daily.    [provider]  norethindrone -ethinyl estradiol-FE (JUNEL FE 1/20) 1-20 MG-MCG tablet Take 1 tablet by mouth daily. Patient not taking: Reported on 03/02/2024 02/01/22   Carlin Rollene HERO, CNM  Prenatal Vit-Fe Fumarate-FA ([employer] PRENATAL) 28-0.8 MG TABS Take 1 tablet by mouth daily. 12/21/16   [provider]  triamcinolone  ointment (KENALOG ) 0.5 % Apply 1  Application topically 2 (two) times daily. To affected areas,avoid face 02/13/23   Defelice, Jeanette, NP  ZEPBOUND 2.5 MG/0.5ML Pen SMARTSIG:2.5 Milligram(s) SUB-Q Once a Week    [provider]    Family History Family History  Problem Relation Age of Onset   Ovarian cancer Paternal Grandmother 86   Breast cancer Neg Hx     Social History Social History   Tobacco Use   Smoking status: Never   Smokeless tobacco: Never  Vaping Use   Vaping status: Never Used  Substance Use Topics   Alcohol use: Not Currently   Drug use: Never     Allergies   Patient has no known allergies.   Review of Systems Review of Systems  Constitutional:  Negative for chills and fever.  HENT:  Positive for congestion. Negative for ear pain and sore throat.   Respiratory:  Positive for wheezing. Negative for shortness of breath.      Physical Exam Triage Vital Signs ED Triage Vitals  Encounter Vitals Group     BP 03/02/24 0936 126/85     Girls Systolic BP Percentile --      Girls Diastolic BP Percentile --      Boys Systolic BP Percentile --      Boys Diastolic BP Percentile --      Pulse Rate 03/02/24 0936 80     Resp 03/02/24 0936 18     Temp 03/02/24 0936 98 F (36.7 C)     Temp src --      SpO2 03/02/24 0936 98 %     Weight --      Height --      Head Circumference --      Peak Flow --      Pain Score 03/02/24 0930 0     Pain Loc --      Pain Education --      Exclude from Growth Chart --    No data found.  Updated Vital Signs BP 126/85   Pulse 80   Temp 98 F (36.7 C)   Resp 18   LMP 02/26/2024   SpO2 98%   Breastfeeding No   Visual Acuity Right Eye Distance:   Left Eye Distance:   Bilateral Distance:    Right Eye Near:   Left Eye Near:    Bilateral Near:     Physical Exam Constitutional:      General: She is not in acute distress. HENT:     Right Ear: Tympanic membrane normal.     Left Ear: Tympanic membrane normal.     Nose: Nose normal.      Mouth/Throat:     Mouth: Mucous membranes are moist.     Pharynx: Oropharynx is clear.  Cardiovascular:     Rate and Rhythm: Normal rate and regular rhythm.     Heart sounds: Normal heart sounds.  Pulmonary:     Effort: Pulmonary effort is normal. No respiratory distress.     Breath sounds: Wheezing and rhonchi present.  Neurological:     Mental Status: She is alert.      UC Treatments / Results  Labs (all labs ordered are listed, but only abnormal results are displayed) Labs Reviewed - No data to display  EKG   Radiology DG Chest 2 View Result Date: 03/02/2024 CLINICAL DATA:  Productive cough, wheezing. EXAM: CHEST - 2 VIEW COMPARISON:  None Available. FINDINGS: The heart size and mediastinal contours are within normal limits. Both lungs are clear. The visualized skeletal structures are unremarkable. IMPRESSION: No active cardiopulmonary disease. Electronically Signed   By: Lynwood Landy Raddle M.D.   On: 03/02/2024 10:14    Procedures Procedures (including critical care time)  Medications Ordered in UC Medications  albuterol  (VENTOLIN  HFA) 108 (90 Base) MCG/ACT inhaler 2 puff (2 puffs Inhalation Given 03/02/24 1009)    Initial Impression / Assessment and Plan / UC Course  I have reviewed the triage vital signs and the nursing notes.  Pertinent labs & imaging results that were available during my care of the patient were reviewed by me and considered in my medical decision making (see chart for details).    Acute upper respiratory infection, productive cough, wheezing.  Chest x-ray negative.  O2 sat 98% on room air.  Albuterol  inhaler given here; wheezing improved but still bilateral throughout.  No shortness of breath.  Patient declines COVID test.  Treating today with albuterol  inhaler, prednisone , Zithromax .  Instructed patient to follow-up with her PCP on Monday.  ED precautions given.  She agrees to plan of care.  Final Clinical Impressions(s) / UC Diagnoses   Final  diagnoses:  Productive cough  Acute upper respiratory infection  Wheezing     Discharge Instructions      Use the albuterol  inhaler as directed.  Take the Zithromax  and prednisone  as directed.  Follow up with your primary care provider tomorrow.  Go to the emergency department if you have worsening symptoms.       ED Prescriptions     Medication Sig Dispense Auth. Provider   predniSONE  (STERAPRED UNI-PAK 21 TAB) 10 MG (21) TBPK tablet Take by mouth daily. As directed 21 tablet Corlis Burnard DEL, NP   azithromycin  (ZITHROMAX ) 250 MG tablet Take 1 tablet (250 mg total) by mouth daily. Take first 2 tablets together, then 1 every day until finished. 6 tablet Corlis Burnard DEL, NP      PDMP not reviewed this encounter.   Corlis Burnard DEL, NP 03/02/24 1024

## 2024-03-02 NOTE — Discharge Instructions (Addendum)
 Use the albuterol inhaler as directed.  Take the Zithromax and prednisone as directed.  Follow up with your primary care provider tomorrow.  Go to the emergency department if you have worsening symptoms.

## 2024-07-10 ENCOUNTER — Ambulatory Visit: Admitting: Dermatology

## 2024-07-10 ENCOUNTER — Encounter: Payer: Self-pay | Admitting: Dermatology

## 2024-07-10 DIAGNOSIS — L814 Other melanin hyperpigmentation: Secondary | ICD-10-CM | POA: Diagnosis not present

## 2024-07-10 DIAGNOSIS — D235 Other benign neoplasm of skin of trunk: Secondary | ICD-10-CM | POA: Diagnosis not present

## 2024-07-10 DIAGNOSIS — W908XXA Exposure to other nonionizing radiation, initial encounter: Secondary | ICD-10-CM | POA: Diagnosis not present

## 2024-07-10 DIAGNOSIS — D492 Neoplasm of unspecified behavior of bone, soft tissue, and skin: Secondary | ICD-10-CM | POA: Diagnosis not present

## 2024-07-10 DIAGNOSIS — Z85828 Personal history of other malignant neoplasm of skin: Secondary | ICD-10-CM | POA: Diagnosis not present

## 2024-07-10 DIAGNOSIS — D1801 Hemangioma of skin and subcutaneous tissue: Secondary | ICD-10-CM | POA: Diagnosis not present

## 2024-07-10 DIAGNOSIS — L820 Inflamed seborrheic keratosis: Secondary | ICD-10-CM | POA: Diagnosis not present

## 2024-07-10 DIAGNOSIS — L578 Other skin changes due to chronic exposure to nonionizing radiation: Secondary | ICD-10-CM | POA: Diagnosis not present

## 2024-07-10 DIAGNOSIS — Z1283 Encounter for screening for malignant neoplasm of skin: Secondary | ICD-10-CM | POA: Diagnosis not present

## 2024-07-10 DIAGNOSIS — L821 Other seborrheic keratosis: Secondary | ICD-10-CM | POA: Diagnosis not present

## 2024-07-10 MED ORDER — FLUOROURACIL 5 % EX CREA: TOPICAL_CREAM | Freq: Two times a day (BID) | CUTANEOUS | 0 refills | Status: DC

## 2024-07-10 NOTE — Patient Instructions (Addendum)
 On 09/11/23 start 5FU/Calcipotriene cream twice a day for 2 weeks to R temple   5-fluorouracil /calcipotriene cream is is a type of field treatment used to treat precancers, thin skin cancers, and areas of sun damage. Expected reaction includes irritation and mild inflammation potentially progressing to more severe inflammation including redness, scaling, crusting and open sores/erosions.  If too much irritation occurs, ensure application of only a thin layer and decrease frequency of use to achieve a tolerable level of inflammation. Recommend applying Vaseline ointment to open sores as needed.  Minimize sun exposure while under treatment. Recommend daily broad spectrum sunscreen SPF 30+ to sun-exposed areas, reapply every 2 hours as needed.     Instructions for [facility] Medications  One or more of your medications was sent to the [facility] mail order compounding pharmacy. You will receive an email from them and can purchase the medicine through that link. It will then be mailed to your home at the address you confirmed. If for any reason you do not receive an email from them, please check your spam folder. If you still do not find the email, please let us  know. [facility] phone number is [phone].   Cryotherapy Aftercare  Wash gently with soap and water everyday.   Apply Vaseline and Band-Aid daily until healed.    Wound Care Instructions  Cleanse wound gently with soap and water once a day then pat dry with clean gauze. Apply a thin coat of Petrolatum (petroleum jelly, Vaseline) over the wound (unless you have an allergy to this). We recommend that you use a new, sterile tube of Vaseline. Do not pick or remove scabs. Do not remove the yellow or white healing tissue from the base of the wound.  Cover the wound with fresh, clean, nonstick gauze and secure with paper tape. You may use Band-Aids in place of gauze and tape if the wound is small enough, but would recommend  trimming much of the tape off as there is often too much. Sometimes Band-Aids can irritate the skin.  You should call the office for your biopsy report after 1 week if you have not already been contacted.  If you experience any problems, such as abnormal amounts of bleeding, swelling, significant bruising, significant pain, or evidence of infection, please call the office immediately.  FOR ADULT SURGERY PATIENTS: If you need something for pain relief you may take 1 extra strength Tylenol  (acetaminophen ) AND 2 Ibuprofen  (200mg  each) together every 4 hours as needed for pain. (do not take these if you are allergic to them or if you have a reason you should not take them.) Typically, you may only need pain medication for 1 to 3 days.     Due to recent changes in healthcare laws, you may see results of your pathology and/or laboratory studies on MyChart before the doctors have had a chance to review them. We understand that in some cases there may be results that are confusing or concerning to you. Please understand that not all results are received at the same time and often the doctors may need to interpret multiple results in order to provide you with the best plan of care or course of treatment. Therefore, we ask that you please give us  2 business days to thoroughly review all your results before contacting the office for clarification. Should we see a critical lab result, you will be contacted sooner.   If You Need Anything After Your Visit  If you have any questions or  concerns for your doctor, please call our main line at [phone] and press option 4 to reach your doctor's medical assistant. If no one answers, please leave a voicemail as directed and we will return your call as soon as possible. Messages left after 4 pm will be answered the following business day.   You may also send us  a message via MyChart. We typically respond to MyChart messages within 1-2 business days.  For  prescription refills, please ask your pharmacy to contact our office. Our fax number is [phone].  If you have an urgent issue when the clinic is closed that cannot wait until the next business day, you can page your doctor at the number below.    Please note that while we do our best to be available for urgent issues outside of office hours, we are not available 24/7.   If you have an urgent issue and are unable to reach us , you may choose to seek medical care at your doctor's office, retail clinic, urgent care center, or emergency room.  If you have a medical emergency, please immediately call 911 or go to the emergency department.  Pager Numbers  - Dr. Hester: [phone]  - Dr. Jackquline: [phone]  - Dr. Claudene: [phone]   - Dr. Raymund: [phone]  In the event of inclement weather, please call our main line at [phone] for an update on the status of any delays or closures.  Dermatology Medication Tips: Please keep the boxes that topical medications come in in order to help keep track of the instructions about where and how to use these. Pharmacies typically print the medication instructions only on the boxes and not directly on the medication tubes.   If your medication is too expensive, please contact our office at [phone] option 4 or send us  a message through MyChart.   We are unable to tell what your co-pay for medications will be in advance as this is different depending on your insurance coverage. However, we may be able to find a substitute medication at lower cost or fill out paperwork to get insurance to cover a needed medication.   If a prior authorization is required to get your medication covered by your insurance company, please allow us  1-2 business days to complete this process.  Drug prices often vary depending on where the prescription is filled and some pharmacies may offer cheaper prices.  The website www.goodrx.com contains coupons for  medications through different pharmacies. The prices here do not account for what the cost may be with help from insurance (it may be cheaper with your insurance), but the website can give you the price if you did not use any insurance.  - You can print the associated coupon and take it with your prescription to the pharmacy.  - You may also stop by our office during regular business hours and pick up a GoodRx coupon card.  - If you need your prescription sent electronically to a different pharmacy, notify our office through [facility] or by phone at [phone] option 4.     Si Usted Necesita Algo Despus de Su Visita  Tambin puede enviarnos un mensaje a travs de Clinical Cytogeneticist. Por lo general respondemos a los mensajes de MyChart en el transcurso de 1 a 2 das hbiles.  Para renovar recetas, por favor pida a su farmacia que se ponga en contacto con nuestra oficina. Randi lakes de fax es [place] [phone].  Si tiene un asunto urgente cuando la clnica est  cerrada y que no puede esperar hasta el siguiente da hbil, puede llamar/localizar a su doctor(a) al nmero que aparece a continuacin.   Por favor, tenga en cuenta que aunque hacemos todo lo posible para estar disponibles para asuntos urgentes fuera del horario de [place], no estamos disponibles las 24 horas del da, los 7 [address]  [address] de la [place].   Si tiene un problema urgente y no puede comunicarse con nosotros, puede optar por buscar atencin mdica  en el consultorio de su doctor(a), en una clnica privada, en un centro de atencin urgente o en una sala de emergencias.  Si tiene engineer, drilling, por favor llame inmediatamente al 911 o vaya a la sala de emergencias.  Nmeros de bper  - Dr. Hester: [phone]  - Dra. Jackquline: [phone]  - Dr. Claudene: [phone]  - Dra. Kitts: [phone]  En caso de inclemencias del [place], por favor llame a nuestra lnea principal al [phone] para una actualizacin sobre el  estado de cualquier retraso o cierre.  Consejos para la medicacin en dermatologa: Por favor, guarde las cajas en las que vienen los medicamentos de uso tpico para ayudarle a seguir las instrucciones sobre dnde y cmo usarlos. Las farmacias generalmente imprimen las instrucciones del medicamento slo en las cajas y no directamente en los tubos del [place].   Si su medicamento es muy caro, por favor, pngase en contacto con landry rieger llamando al [phone] y presione la opcin 4 o envenos un mensaje a travs de Clinical Cytogeneticist.   No podemos decirle cul ser su copago por los medicamentos por adelantado ya que esto es diferente dependiendo de la cobertura de su seguro. Sin embargo, es posible que podamos encontrar un medicamento sustituto a audiological scientist un formulario para que el seguro cubra el medicamento que se considera necesario.   Si se requiere una autorizacin previa para que su compaa de seguros cubra su medicamento, por favor permtanos de 1 a 2 das hbiles para completar este proceso.  Los precios de los medicamentos varan con frecuencia dependiendo del environmental consultant de dnde se surte la receta y alguna farmacias pueden ofrecer precios ms baratos.  El sitio web www.goodrx.com tiene cupones para medicamentos de health and safety inspector. Los precios aqu no tienen en cuenta lo que podra costar con la ayuda del seguro (puede ser ms barato con su seguro), pero el sitio web puede darle el precio si no utiliz tourist information centre manager.  - Puede imprimir el cupn correspondiente y llevarlo con su receta a la farmacia.  - Tambin puede pasar por nuestra oficina durante el horario de atencin regular y education officer, museum una tarjeta de cupones de GoodRx.  - Si necesita que su receta se enve electrnicamente a una farmacia diferente, informe a nuestra oficina a travs de MyChart de [place] o por telfono llamando al [phone] y presione la opcin 4.

## 2024-07-10 NOTE — Progress Notes (Unsigned)
 Follow-Up Visit   Subjective  Angela [name] is a 48 y.o. female who presents for the following: Skin Cancer Screening and Full Body Skin Exam hx of BCC, check previous BCC site R temple, pt not sure if scar tissue or recurrence  The patient presents for Total-Body Skin Exam (TBSE) for skin cancer screening and mole check. The patient has spots, moles and lesions to be evaluated, some may be new or changing and the patient may have concern these could be cancer.  The following portions of the chart were reviewed this encounter and updated as appropriate: medications, allergies, medical history  Review of Systems:  No other skin or systemic complaints except as noted in HPI or Assessment and Plan.  Objective  Well appearing patient in no apparent distress; mood and affect are within normal limits.  A full examination was performed including scalp, head, eyes, ears, nose, lips, neck, chest, axillae, abdomen, back, buttocks, bilateral upper extremities, bilateral lower extremities, hands, feet, fingers, toes, fingernails, and toenails. All findings within normal limits unless otherwise noted below.   Relevant physical exam findings are noted in the Assessment and Plan.  HISTORY OF BASAL CELL CARCINOMA OF THE SKIN - No evidence of recurrence today - Recommend regular full body skin exams - Recommend daily broad spectrum sunscreen SPF 30+ to sun-exposed areas, reapply every 2 hours as needed.  - Call if any new or changing lesions are noted between office visits  - R temple scar clear today, no evidence of recurrence but recommend adjunct field treatment with 5-FU/calcipotriene.  On 09/11/23 start 5FU/Calcipotriene cr bid for 2 weeks to R temple  Reviewed course of treatment and expected reaction.  Patient advised to expect inflammation and crusting and advised that erosions are possible.  Patient advised to be diligent with sun protection during and after treatment. Handout with details  of how to apply medication and what to expect provided. Counseled to keep medication out of reach of children and pets. R temple above BCC scar x 1 Stuck on waxy paps with erythema R sup medial scapula Dark grey blue pap 0.3cm   Assessment & Plan   SKIN CANCER SCREENING PERFORMED TODAY.  ACTINIC DAMAGE - Chronic condition, secondary to cumulative UV/sun exposure - diffuse scaly erythematous macules with underlying dyspigmentation - Recommend daily broad spectrum sunscreen SPF 30+ to sun-exposed areas, reapply every 2 hours as needed.  - Staying in the shade or wearing long sleeves, sun glasses (UVA+UVB protection) and wide brim hats (4-inch brim around the entire circumference of the hat) are also recommended for sun protection.  - Call for new or changing lesions.  LENTIGINES, SEBORRHEIC KERATOSES, HEMANGIOMAS - Benign normal skin lesions - Benign-appearing - Call for any changes  MELANOCYTIC NEVI - Tan-brown and/or pink-flesh-colored symmetric macules and papules - Benign appearing on exam today - Observation - Call clinic for new or changing moles - Recommend daily use of broad spectrum spf 30+ sunscreen to sun-exposed areas.   INFLAMED SEBORRHEIC KERATOSIS R temple above BCC scar x 1 Symptomatic, irritating, patient would like treated. - Destruction of lesion - R temple above BCC scar x 1 Complexity: simple   Destruction method: cryotherapy   Informed consent: discussed and consent obtained   Timeout:  patient name, date of birth, surgical site, and procedure verified Lesion destroyed using liquid nitrogen: Yes   Region frozen until ice ball extended beyond lesion: Yes   Outcome: patient tolerated procedure well with no complications   Post-procedure details: wound  care instructions given    NEOPLASM OF SKIN R sup medial scapula - Epidermal / dermal shaving  Lesion diameter (cm):  0.3 Informed consent: discussed and consent obtained   Timeout: patient name, date of  birth, surgical site, and procedure verified   Procedure prep:  Patient was prepped and draped in usual sterile fashion Prep type:  Isopropyl alcohol Anesthesia: the lesion was anesthetized in a standard fashion   Anesthetic:  1% lidocaine  w/ epinephrine 1-100,000 buffered w/ 8.4% NaHCO3 Instrument used: flexible razor blade   Hemostasis achieved with: pressure, aluminum chloride and electrodesiccation   Outcome: patient tolerated procedure well   Post-procedure details: sterile dressing applied and wound care instructions given   Dressing type: bandage and petrolatum    Specimen 1 - Surgical pathology Differential Diagnosis: Nevus vs Blue Nevus r/o Atypia  Check Margins: yes Dark grey blue pap 0.3cm SKIN CANCER SCREENING   ACTINIC SKIN DAMAGE   LENTIGO   MELANOCYTIC NEVUS, UNSPECIFIED LOCATION   HISTORY OF BASAL CELL CARCINOMA   CHEMOTHERAPY MANAGEMENT, ENCOUNTER FOR   COUNSELING AND COORDINATION OF CARE   MEDICATION MANAGEMENT   Return in about 1 year (around 07/10/2025) for TBSE, recheck hx of BCC after 7fu/calcipotriene R temple.  I, Grayce Saunas, RMA, am acting as scribe for Alm Rhyme, MD .   Documentation: I have reviewed the above documentation for accuracy and completeness, and I agree with the above.  Alm Rhyme, MD

## 2024-07-11 ENCOUNTER — Encounter: Payer: Self-pay | Admitting: Dermatology

## 2024-07-13 LAB — SURGICAL PATHOLOGY

## 2024-07-16 ENCOUNTER — Ambulatory Visit: Payer: Self-pay | Admitting: Dermatology

## 2024-07-16 MED ORDER — FLUOROURACIL 5 % EX CREA: TOPICAL_CREAM | Freq: Two times a day (BID) | CUTANEOUS | 0 refills | Status: AC

## 2024-07-16 MED ORDER — FLUOROURACIL 5 % EX CREA: TOPICAL_CREAM | Freq: Two times a day (BID) | CUTANEOUS | 0 refills | Status: DC

## 2024-07-16 NOTE — Addendum Note (Signed)
 Addended by: Hakim Minniefield R on: 07/16/2024 03:55 PM   Modules accepted: Orders

## 2024-07-16 NOTE — Telephone Encounter (Signed)
-----   Message from Alm Rhyme, MD sent at 07/16/2024  1:40 PM EST ----- FINAL DIAGNOSIS        1. Skin, R sup medial scapula :       BLUE NEVUS, PERIPHERAL AND DEEP MARGINS INVOLVED   Benign Blue Nevus May persist or recur No further treatment needed

## 2024-07-16 NOTE — Telephone Encounter (Signed)
 Advised pt of bx results.  Patient also had a question about 5FU/Calcipotriene that was sent in.  [facility] advised her to have us  resend medication closer to date b/c of the short shelf life with the compounded medications and  they canceled her prescription.  I advised I would resend prescription and put a note to hold prescription until February 2026./sh

## 2025-07-16 ENCOUNTER — Ambulatory Visit: Admitting: Dermatology
